# Patient Record
Sex: Female | Born: 1935 | Race: White | Hispanic: No | Smoking: Never smoker
Health system: Southern US, Community
[De-identification: ages and names within clinical notes are randomized; demographics above are authoritative.]

## PROBLEM LIST (undated history)

## (undated) DIAGNOSIS — Z8739 Personal history of other diseases of the musculoskeletal system and connective tissue: Secondary | ICD-10-CM

## (undated) DIAGNOSIS — C801 Malignant (primary) neoplasm, unspecified: Secondary | ICD-10-CM

## (undated) DIAGNOSIS — H903 Sensorineural hearing loss, bilateral: Secondary | ICD-10-CM

## (undated) DIAGNOSIS — E559 Vitamin D deficiency, unspecified: Secondary | ICD-10-CM

## (undated) DIAGNOSIS — K5520 Angiodysplasia of colon without hemorrhage: Secondary | ICD-10-CM

## (undated) DIAGNOSIS — K219 Gastro-esophageal reflux disease without esophagitis: Secondary | ICD-10-CM

## (undated) DIAGNOSIS — K639 Disease of intestine, unspecified: Secondary | ICD-10-CM

## (undated) DIAGNOSIS — E7800 Pure hypercholesterolemia, unspecified: Secondary | ICD-10-CM

## (undated) DIAGNOSIS — R002 Palpitations: Secondary | ICD-10-CM

## (undated) DIAGNOSIS — I10 Essential (primary) hypertension: Secondary | ICD-10-CM

## (undated) DIAGNOSIS — R32 Unspecified urinary incontinence: Secondary | ICD-10-CM

## (undated) HISTORY — PX: CHOLECYSTECTOMY: SHX55

## (undated) HISTORY — PX: ABDOMINAL HYSTERECTOMY: SHX81

## (undated) HISTORY — PX: COLONOSCOPY: SHX174

## (undated) HISTORY — PX: BACK SURGERY: SHX140

---

## 2004-09-04 ENCOUNTER — Ambulatory Visit: Payer: Self-pay | Admitting: Internal Medicine

## 2005-03-26 ENCOUNTER — Ambulatory Visit: Payer: Self-pay | Admitting: Unknown Physician Specialty

## 2005-10-31 ENCOUNTER — Ambulatory Visit: Payer: Self-pay | Admitting: Internal Medicine

## 2006-11-21 ENCOUNTER — Ambulatory Visit: Payer: Self-pay | Admitting: Internal Medicine

## 2007-12-11 ENCOUNTER — Ambulatory Visit: Payer: Self-pay | Admitting: Internal Medicine

## 2008-05-20 ENCOUNTER — Ambulatory Visit: Payer: Self-pay | Admitting: Physician Assistant

## 2008-05-24 ENCOUNTER — Ambulatory Visit: Payer: Self-pay | Admitting: Unknown Physician Specialty

## 2008-12-22 ENCOUNTER — Ambulatory Visit: Payer: Self-pay | Admitting: Internal Medicine

## 2009-11-25 ENCOUNTER — Ambulatory Visit: Payer: Self-pay | Admitting: Internal Medicine

## 2010-01-04 ENCOUNTER — Ambulatory Visit: Payer: Self-pay | Admitting: Internal Medicine

## 2010-06-15 ENCOUNTER — Ambulatory Visit: Payer: Self-pay | Admitting: Unknown Physician Specialty

## 2010-09-03 DIAGNOSIS — R002 Palpitations: Secondary | ICD-10-CM | POA: Insufficient documentation

## 2010-09-03 HISTORY — DX: Palpitations: R00.2

## 2011-02-20 ENCOUNTER — Ambulatory Visit: Payer: Self-pay | Admitting: Internal Medicine

## 2012-02-21 ENCOUNTER — Ambulatory Visit: Payer: Self-pay | Admitting: Internal Medicine

## 2013-02-23 ENCOUNTER — Ambulatory Visit: Payer: Self-pay | Admitting: Internal Medicine

## 2014-02-24 ENCOUNTER — Ambulatory Visit: Payer: Self-pay | Admitting: Internal Medicine

## 2014-03-08 ENCOUNTER — Ambulatory Visit: Payer: Self-pay | Admitting: Otolaryngology

## 2015-01-06 ENCOUNTER — Other Ambulatory Visit: Payer: Self-pay | Admitting: Internal Medicine

## 2015-01-06 DIAGNOSIS — Z1231 Encounter for screening mammogram for malignant neoplasm of breast: Secondary | ICD-10-CM

## 2015-03-01 ENCOUNTER — Ambulatory Visit
Admission: RE | Admit: 2015-03-01 | Discharge: 2015-03-01 | Disposition: A | Discharge status: Home / Self Care | Payer: Medicare Other | Source: Ambulatory Visit | Attending: Internal Medicine | Admitting: Internal Medicine

## 2015-03-01 DIAGNOSIS — Z1231 Encounter for screening mammogram for malignant neoplasm of breast: Secondary | ICD-10-CM | POA: Diagnosis present

## 2015-03-01 HISTORY — DX: Malignant (primary) neoplasm, unspecified: C80.1

## 2015-07-07 DIAGNOSIS — D692 Other nonthrombocytopenic purpura: Secondary | ICD-10-CM | POA: Insufficient documentation

## 2015-07-07 DIAGNOSIS — Z85820 Personal history of malignant melanoma of skin: Secondary | ICD-10-CM | POA: Insufficient documentation

## 2016-01-10 ENCOUNTER — Other Ambulatory Visit: Payer: Self-pay | Admitting: Internal Medicine

## 2016-01-10 DIAGNOSIS — R739 Hyperglycemia, unspecified: Secondary | ICD-10-CM | POA: Insufficient documentation

## 2016-01-10 DIAGNOSIS — Z1231 Encounter for screening mammogram for malignant neoplasm of breast: Secondary | ICD-10-CM

## 2016-01-10 DIAGNOSIS — R7303 Prediabetes: Secondary | ICD-10-CM | POA: Insufficient documentation

## 2016-03-01 ENCOUNTER — Ambulatory Visit
Admission: RE | Admit: 2016-03-01 | Discharge: 2016-03-01 | Disposition: A | Discharge status: Home / Self Care | Payer: Medicare Other | Source: Ambulatory Visit | Attending: Internal Medicine | Admitting: Internal Medicine

## 2016-03-01 DIAGNOSIS — Z1231 Encounter for screening mammogram for malignant neoplasm of breast: Secondary | ICD-10-CM | POA: Diagnosis not present

## 2017-01-18 ENCOUNTER — Other Ambulatory Visit: Payer: Self-pay | Admitting: Internal Medicine

## 2017-01-18 DIAGNOSIS — Z1231 Encounter for screening mammogram for malignant neoplasm of breast: Secondary | ICD-10-CM

## 2017-03-04 ENCOUNTER — Ambulatory Visit
Admission: RE | Admit: 2017-03-04 | Discharge: 2017-03-04 | Disposition: A | Discharge status: Home / Self Care | Payer: Medicare Other | Source: Ambulatory Visit | Attending: Internal Medicine | Admitting: Internal Medicine

## 2017-03-04 DIAGNOSIS — Z1231 Encounter for screening mammogram for malignant neoplasm of breast: Secondary | ICD-10-CM | POA: Insufficient documentation

## 2017-07-25 ENCOUNTER — Encounter: Payer: Self-pay | Admitting: *Deleted

## 2017-07-26 ENCOUNTER — Encounter
Admission: RE | Disposition: A | Discharge status: Home / Self Care | Payer: Self-pay | Source: Ambulatory Visit | Attending: Unknown Physician Specialty

## 2017-07-26 ENCOUNTER — Other Ambulatory Visit: Payer: Self-pay

## 2017-07-26 ENCOUNTER — Encounter: Payer: Self-pay | Admitting: *Deleted

## 2017-07-26 ENCOUNTER — Ambulatory Visit: Payer: Medicare Other | Admitting: Anesthesiology

## 2017-07-26 ENCOUNTER — Ambulatory Visit
Admission: RE | Admit: 2017-07-26 | Discharge: 2017-07-26 | Disposition: A | Discharge status: Home / Self Care | Payer: Medicare Other | Source: Ambulatory Visit | Attending: Unknown Physician Specialty | Admitting: Unknown Physician Specialty

## 2017-07-26 DIAGNOSIS — I10 Essential (primary) hypertension: Secondary | ICD-10-CM | POA: Insufficient documentation

## 2017-07-26 DIAGNOSIS — E559 Vitamin D deficiency, unspecified: Secondary | ICD-10-CM | POA: Diagnosis not present

## 2017-07-26 DIAGNOSIS — Q438 Other specified congenital malformations of intestine: Secondary | ICD-10-CM | POA: Insufficient documentation

## 2017-07-26 DIAGNOSIS — K640 First degree hemorrhoids: Secondary | ICD-10-CM | POA: Diagnosis not present

## 2017-07-26 DIAGNOSIS — Z79899 Other long term (current) drug therapy: Secondary | ICD-10-CM | POA: Diagnosis not present

## 2017-07-26 DIAGNOSIS — D120 Benign neoplasm of cecum: Secondary | ICD-10-CM | POA: Diagnosis not present

## 2017-07-26 DIAGNOSIS — Z86010 Personal history of colonic polyps: Secondary | ICD-10-CM | POA: Insufficient documentation

## 2017-07-26 DIAGNOSIS — Z800 Family history of malignant neoplasm of digestive organs: Secondary | ICD-10-CM | POA: Insufficient documentation

## 2017-07-26 DIAGNOSIS — Z85828 Personal history of other malignant neoplasm of skin: Secondary | ICD-10-CM | POA: Insufficient documentation

## 2017-07-26 DIAGNOSIS — K219 Gastro-esophageal reflux disease without esophagitis: Secondary | ICD-10-CM | POA: Insufficient documentation

## 2017-07-26 DIAGNOSIS — Z1211 Encounter for screening for malignant neoplasm of colon: Secondary | ICD-10-CM | POA: Diagnosis not present

## 2017-07-26 HISTORY — PX: COLONOSCOPY WITH PROPOFOL: SHX5780

## 2017-07-26 HISTORY — DX: Pure hypercholesterolemia, unspecified: E78.00

## 2017-07-26 HISTORY — DX: Palpitations: R00.2

## 2017-07-26 HISTORY — DX: Personal history of other diseases of the musculoskeletal system and connective tissue: Z87.39

## 2017-07-26 HISTORY — DX: Unspecified urinary incontinence: R32

## 2017-07-26 HISTORY — DX: Vitamin D deficiency, unspecified: E55.9

## 2017-07-26 HISTORY — DX: Disease of intestine, unspecified: K63.9

## 2017-07-26 HISTORY — DX: Angiodysplasia of colon without hemorrhage: K55.20

## 2017-07-26 HISTORY — DX: Essential (primary) hypertension: I10

## 2017-07-26 HISTORY — DX: Gastro-esophageal reflux disease without esophagitis: K21.9

## 2017-07-26 SURGERY — COLONOSCOPY WITH PROPOFOL
Anesthesia: General

## 2017-07-26 MED ORDER — EPHEDRINE SULFATE-NACL 50-0.9 MG/10ML-% IV SOSY
PREFILLED_SYRINGE | INTRAVENOUS | Status: DC | PRN
  Administered 2017-07-26: 10 mg via INTRAVENOUS

## 2017-07-26 MED ORDER — PROPOFOL 500 MG/50ML IV EMUL
INTRAVENOUS | Status: AC
  Filled 2017-07-26: qty 50

## 2017-07-26 MED ORDER — SODIUM CHLORIDE 0.9 % IV SOLN: INTRAVENOUS | Status: DC

## 2017-07-26 MED ORDER — EPHEDRINE SULFATE 50 MG/ML IJ SOLN
INTRAMUSCULAR | Status: AC
  Filled 2017-07-26: qty 1

## 2017-07-26 MED ORDER — PROPOFOL 10 MG/ML IV BOLUS
INTRAVENOUS | Status: DC | PRN
  Administered 2017-07-26: 90 mg via INTRAVENOUS

## 2017-07-26 MED ORDER — SODIUM CHLORIDE 0.9 % IV SOLN
INTRAVENOUS | Status: DC
  Administered 2017-07-26: 08:00:00 via INTRAVENOUS

## 2017-07-26 MED ORDER — PROPOFOL 500 MG/50ML IV EMUL
INTRAVENOUS | Status: DC | PRN
  Administered 2017-07-26: 100 ug/kg/min via INTRAVENOUS

## 2017-07-26 MED ORDER — GLYCOPYRROLATE 0.2 MG/ML IJ SOLN
INTRAMUSCULAR | Status: AC
  Filled 2017-07-26: qty 1

## 2017-07-26 NOTE — Anesthesia Post-op Follow-up Note (Signed)
Anesthesia QCDR form completed.        

## 2017-07-26 NOTE — OR Nursing (Signed)
Switched to pediatric scope

## 2017-07-26 NOTE — Op Note (Signed)
[facility] Gastroenterology Patient Name: Jacqueline Carney Procedure Date: 07/26/2017 8:16 AM MRN: [id number] Account #: [id number] Date of Birth: Aug 14, 1933 Admit Type: Outpatient Age: 84 Room: [facility] ENDO ROOM 4 Gender: Female Note Status: Finalized Procedure:            Colonoscopy Indications:          High risk colon cancer surveillance: Personal history                        of colonic polyps Providers:            Manya Silvas, MD Referring MD:         Ramonita Lab, MD (Referring MD) Medicines:            Propofol per Anesthesia Complications:        No immediate complications. Procedure:            Pre-Anesthesia Assessment:                       - After reviewing the risks and benefits, the patient                        was deemed in satisfactory condition to undergo the                        procedure.                       After obtaining informed consent, the colonoscope was                        passed under direct vision. Throughout the procedure,                        the patient's blood pressure, pulse, and oxygen                        saturations were monitored continuously. The                        Colonoscope was introduced through the anus and                        advanced to the the cecum, identified by appendiceal                        orifice and ileocecal valve. The colonoscopy was                        performed with difficulty due to a redundant colon,                        significant looping and a tortuous colon. Successful                        completion of the procedure was aided by withdrawing                        the scope and replacing with the pediatric colonoscope.  The patient tolerated the procedure well. The quality                        of the bowel preparation was excellent. Findings:      The colon was very tortuous with difficult passage and a pediatric       colonoscope worked  very well.      A diminutive polyp was found in the cecum. The polyp was sessile. The       polyp was removed with a jumbo cold forceps. Resection and retrieval       were complete.      Internal hemorrhoids were found during endoscopy. The hemorrhoids were       small and Grade I (internal hemorrhoids that do not prolapse).      The exam was otherwise without abnormality. Impression:           - One diminutive polyp in the cecum, removed with a                        jumbo cold forceps. Resected and retrieved.                       - Internal hemorrhoids.                       - The examination was otherwise normal. Recommendation:       - Await pathology results. Manya Silvas, MD 07/26/2017 9:18:27 AM This report has been signed electronically. Number of Addenda: 0 Note Initiated On: 07/26/2017 8:16 AM Scope Withdrawal Time: 0 hours 13 minutes 15 seconds  Total Procedure Duration: 0 hours 35 minutes 29 seconds       [facility]

## 2017-07-26 NOTE — Transfer of Care (Signed)
Immediate Anesthesia Transfer of Care Note  Patient: Jacqueline Carney  Procedure(s) Performed: COLONOSCOPY WITH PROPOFOL (N/A )  Patient Location: Endoscopy Unit  Anesthesia Type:General  Level of Consciousness: drowsy and patient cooperative  Airway & Oxygen Therapy: Patient Spontanous Breathing and Patient connected to nasal cannula oxygen  Post-op Assessment: Report given to RN and Post -op Vital signs reviewed and stable  Post vital signs: Reviewed and stable  Last Vitals:  Vitals Value Taken Time  BP 134/58 07/26/2017  9:19 AM  Temp 36.1 C 07/26/2017  9:18 AM  Pulse 73 07/26/2017  9:20 AM  Resp 22 07/26/2017  9:20 AM  SpO2 100 % 07/26/2017  9:20 AM  Vitals shown include unvalidated device data.  Last Pain:  Vitals:   07/26/17 0918  TempSrc: Tympanic  PainSc:          Complications: No apparent anesthesia complications

## 2017-07-26 NOTE — Anesthesia Preprocedure Evaluation (Signed)
Anesthesia Evaluation  Patient identified by MRN, date of birth, ID band Patient awake    Reviewed: Allergy & Precautions, NPO status , Patient's Chart, lab work & pertinent test results  History of Anesthesia Complications Negative for: history of anesthetic complications  Airway Mallampati: II       Dental   Pulmonary neg sleep apnea, neg COPD,           Cardiovascular (-) hypertension(-) Past MI and (-) CHF (-) dysrhythmias (-) Valvular Problems/Murmurs     Neuro/Psych neg Seizures    GI/Hepatic Neg liver ROS, GERD  Medicated and Controlled,  Endo/Other  neg diabetes  Renal/GU negative Renal ROS     Musculoskeletal   Abdominal   Peds  Hematology   Anesthesia Other Findings   Reproductive/Obstetrics                             Anesthesia Physical Anesthesia Plan  ASA: II  Anesthesia Plan: General   Post-op Pain Management:    Induction: Intravenous  PONV Risk Score and Plan: 3 and TIVA, Propofol infusion and Treatment may vary due to age or medical condition  Airway Management Planned: Nasal Cannula  Additional Equipment:   Intra-op Plan:   Post-operative Plan:   Informed Consent: I have reviewed the patients History and Physical, chart, labs and discussed the procedure including the risks, benefits and alternatives for the proposed anesthesia with the patient or authorized representative who has indicated his/her understanding and acceptance.     Plan Discussed with:   Anesthesia Plan Comments:         Anesthesia Quick Evaluation

## 2017-07-26 NOTE — H&P (Signed)
Primary Care Physician:  Adin Hector, MD Primary Gastroenterologist:  Dr. Vira Agar  Pre-Procedure History & Physical: HPI:  Jacqueline Carney is a 84 y.o. female is here for an colonoscopy.  For personal history of colon polyps and family history of colon cancer in mother.   Past Medical History:  Diagnosis Date  . Bladder incontinence   . Cancer ([place])    skin  . GERD (gastroesophageal reflux disease)   . H/O degenerative disc disease   . Hypercholesterolemia   . Hypertension   . Palpitations 09/2010   stress echo negative for ischemia, holter without sigmificant abnormality  . Vascular ectasia of colon   . Vitamin D deficiency, unspecified     Past Surgical History:  Procedure Laterality Date  . ABDOMINAL HYSTERECTOMY    . BACK SURGERY    . CHOLECYSTECTOMY    . COLONOSCOPY     with colon polyps    Prior to Admission medications   Medication Sig Start Date End Date Taking? Authorizing Provider  calcium-vitamin D (OSCAL WITH D) 500-200 MG-UNIT tablet Take 1 tablet by mouth daily.   Yes [provider]  cyanocobalamin 1000 MCG tablet Take 1,000 mcg by mouth daily.   Yes [provider]  docusate sodium (COLACE) 100 MG capsule Take 100 mg by mouth daily.   Yes [provider]  pantoprazole (PROTONIX) 40 MG tablet Take 40 mg by mouth daily.   Yes [provider]    Allergies as of 06/26/2017  . (No Known Allergies)    Family History  Problem Relation Age of Onset  . Breast cancer Neg Hx     Social History   Socioeconomic History  . Marital status: Widowed    Spouse name: Not on file  . Number of children: Not on file  . Years of education: Not on file  . Highest education level: Not on file  Occupational History  . Not on file  Social Needs  . Financial resource strain: Not on file  . Food insecurity:    Worry: Not on file    Inability: Not on file  . Transportation needs:    Medical: Not on file   Non-medical: Not on file  Tobacco Use  . Smoking status: Never Smoker  . Smokeless tobacco: Never Used  Substance and Sexual Activity  . Alcohol use: Never    Frequency: Never  . Drug use: Never  . Sexual activity: Not on file  Lifestyle  . Physical activity:    Days per week: Not on file    Minutes per session: Not on file  . Stress: Not on file  Relationships  . Social connections:    Talks on phone: Not on file    Gets together: Not on file    Attends religious service: Not on file    Active member of club or organization: Not on file    Attends meetings of clubs or organizations: Not on file    Relationship status: Not on file  . Intimate partner violence:    Fear of current or ex partner: Not on file    Emotionally abused: Not on file    Physically abused: Not on file    Forced sexual activity: Not on file  Other Topics Concern  . Not on file  Social History Narrative  . Not on file    Review of Systems: See HPI, otherwise negative ROS  Physical Exam: BP (!) 154/55   Pulse 78  Temp (!) 97.4 F (36.3 C) (Tympanic)   Resp 20   Ht 5\' 5"  (1.651 m)   Wt 83.9 kg (185 lb)   SpO2 100%   BMI 30.79 kg/m  General:   Alert,  pleasant and cooperative in NAD Head:  Normocephalic and atraumatic. Neck:  Supple; no masses or thyromegaly. Lungs:  Clear throughout to auscultation.    Heart:  Regular rate and rhythm. Abdomen:  Soft, nontender and nondistended. Normal bowel sounds, without guarding, and without rebound.   Neurologic:  Alert and  oriented x4;  grossly normal neurologically.  Impression/Plan: DAVETTE Carney is here for an colonoscopy to be performed for [facility] colon polyps and family history of colon cancer.  Risks, benefits, limitations, and alternatives regarding  colonoscopy have been reviewed with the patient.  Questions have been answered.  All parties agreeable.   Gaylyn Cheers, MD  07/26/2017, 8:30 AM

## 2017-07-26 NOTE — Anesthesia Postprocedure Evaluation (Signed)
Anesthesia Post Note  Patient: Jacqueline Carney  Procedure(s) Performed: COLONOSCOPY WITH PROPOFOL (N/A )  Patient location during evaluation: Endoscopy Anesthesia Type: General Level of consciousness: awake and alert Pain management: pain level controlled Vital Signs Assessment: post-procedure vital signs reviewed and stable Respiratory status: spontaneous breathing and respiratory function stable Cardiovascular status: stable Anesthetic complications: no     Last Vitals:  Vitals:   07/26/17 0819 07/26/17 0918  BP: (!) 154/55 (!) 134/58  Pulse: 78   Resp: 20   Temp: (!) 36.3 C (!) 36.1 C  SpO2: 100%     Last Pain:  Vitals:   07/26/17 0918  TempSrc: Tympanic  PainSc:                  [name] K

## 2017-07-30 ENCOUNTER — Encounter: Payer: Self-pay | Admitting: Unknown Physician Specialty

## 2017-07-30 LAB — SURGICAL PATHOLOGY

## 2017-12-09 ENCOUNTER — Emergency Department: Payer: Medicare Other

## 2017-12-09 ENCOUNTER — Emergency Department
Admission: EM | Admit: 2017-12-09 | Discharge: 2017-12-09 | Disposition: A | Discharge status: Home / Self Care | Payer: Medicare Other | Attending: Student in an Organized Health Care Education/Training Program | Admitting: Student in an Organized Health Care Education/Training Program

## 2017-12-09 ENCOUNTER — Encounter: Payer: Self-pay | Admitting: Emergency Medicine

## 2017-12-09 DIAGNOSIS — R42 Dizziness and giddiness: Secondary | ICD-10-CM | POA: Insufficient documentation

## 2017-12-09 DIAGNOSIS — Z79899 Other long term (current) drug therapy: Secondary | ICD-10-CM | POA: Diagnosis not present

## 2017-12-09 DIAGNOSIS — I10 Essential (primary) hypertension: Secondary | ICD-10-CM | POA: Insufficient documentation

## 2017-12-09 LAB — COMPREHENSIVE METABOLIC PANEL
ALBUMIN: 4 g/dL (ref 3.5–5.0)
ALK PHOS: 74 U/L (ref 38–126)
ALT: 28 U/L (ref 0–44)
AST: 30 U/L (ref 15–41)
Anion gap: 8 (ref 5–15)
BUN: 19 mg/dL (ref 8–23)
CHLORIDE: 106 mmol/L (ref 98–111)
CO2: 27 mmol/L (ref 22–32)
CREATININE: 0.76 mg/dL (ref 0.44–1.00)
Calcium: 9 mg/dL (ref 8.9–10.3)
GFR calc Af Amer: >60 mL/min (ref >60)
GFR calc non Af Amer: >60 mL/min (ref >60)
GLUCOSE: 117 mg/dL — AB (ref 70–99)
Potassium: 4.4 mmol/L (ref 3.5–5.1)
SODIUM: 141 mmol/L (ref 135–145)
Total Bilirubin: 0.8 mg/dL (ref 0.3–1.2)
Total Protein: 6.6 g/dL (ref 6.5–8.1)

## 2017-12-09 LAB — URINALYSIS, COMPLETE (UACMP) WITH MICROSCOPIC
Bilirubin Urine: NEGATIVE
Glucose, UA: NEGATIVE mg/dL
Hgb urine dipstick: NEGATIVE
KETONES UR: NEGATIVE mg/dL
Leukocytes, UA: NEGATIVE
Nitrite: NEGATIVE
PROTEIN: NEGATIVE mg/dL
Specific Gravity, Urine: 1.014 (ref 1.005–1.030)
pH: 6 (ref 5.0–8.0)

## 2017-12-09 LAB — CBC WITH DIFFERENTIAL/PLATELET
BASOS ABS: 0 10*3/uL (ref 0–0.1)
Basophils Relative: 1 %
Eosinophils Absolute: 0.1 10*3/uL (ref 0–0.7)
Eosinophils Relative: 2 %
HCT: 40.6 % (ref 35.0–47.0)
HEMOGLOBIN: 14.1 g/dL (ref 12.0–16.0)
Lymphocytes Relative: 35 %
Lymphs Abs: 1.8 10*3/uL (ref 1.0–3.6)
MCH: 32.7 pg (ref 26.0–34.0)
MCHC: 34.8 g/dL (ref 32.0–36.0)
MCV: 94 fL (ref 80.0–100.0)
Monocytes Absolute: 0.4 10*3/uL (ref 0.2–0.9)
Monocytes Relative: 7 %
NEUTROS PCT: 55 %
Neutro Abs: 2.8 10*3/uL (ref 1.4–6.5)
Platelets: 187 10*3/uL (ref 150–440)
RBC: 4.32 MIL/uL (ref 3.80–5.20)
RDW: 13.4 % (ref 11.5–14.5)
WBC: 5 10*3/uL (ref 3.6–11.0)

## 2017-12-09 LAB — TROPONIN I: Troponin I: <0.03 ng/mL (ref <0.03)

## 2017-12-09 MED ORDER — SODIUM CHLORIDE 0.9 % IV BOLUS
500.0000 mL | Freq: Once | INTRAVENOUS | Status: AC
  Administered 2017-12-09: 500 mL via INTRAVENOUS

## 2017-12-09 NOTE — ED Notes (Signed)
Pt able to ambulate in room with no complaints of dizziness.

## 2017-12-09 NOTE — Discharge Instructions (Addendum)
Please follow up with PCP this week for repeat BP check and evaluation.  Return to the ER for worsening symptoms, questions or concerns.

## 2017-12-09 NOTE — ED Notes (Signed)
ED Provider at bedside. 

## 2017-12-09 NOTE — ED Provider Notes (Signed)
[facility] Emergency Department Provider Note    First MD Initiated Contact with Patient 12/09/17 0932     (approximate)  I have reviewed the triage vital signs and the nursing notes.   HISTORY  Chief Complaint Dizziness    HPI Jacqueline Carney is a 84 y.o. female below listed past medical history presents the ER with chief complaint of lightheadedness and "dizziness" that started this morning she awoke from bed.  States that she felt lightheaded and was afraid that she was in a fall over.  Laid in bed for quite a while and then finally after having to use the restroom decided to try getting up to go to the bathroom.  States that she was having to hold onto furniture and walls to make it to the bathroom.  Did not fall and hit her head.  Just felt unsteady on her feet.  Denies any chest pain or shortness of breath.  No nausea or vomiting.  No numbness or tingling.  No headaches but feels that her "head feels funny ".  Cannot provide any additional characteristics or descriptors of this symptom.  Denies any previous history of stroke.  Did take aspirin.  On arrival to the ER feels that her symptoms have resolved.    Past Medical History:  Diagnosis Date  . Bladder incontinence   . Cancer ([place])    skin  . GERD (gastroesophageal reflux disease)   . H/O degenerative disc disease   . Hypercholesterolemia   . Hypertension   . Palpitations 09/2010   stress echo negative for ischemia, holter without sigmificant abnormality  . Vascular ectasia of colon   . Vitamin D deficiency, unspecified    Family History  Problem Relation Age of Onset  . Breast cancer Neg Hx    Past Surgical History:  Procedure Laterality Date  . ABDOMINAL HYSTERECTOMY    . BACK SURGERY    . CHOLECYSTECTOMY    . COLONOSCOPY     with colon polyps  . COLONOSCOPY WITH PROPOFOL N/A 07/26/2017   Procedure: COLONOSCOPY WITH PROPOFOL;  Surgeon: Manya Silvas, MD;  Location: [facility]  ENDOSCOPY;  Service: Endoscopy;  Laterality: N/A;   There are no active problems to display for this patient.     Prior to Admission medications   Medication Sig Start Date End Date Taking? Authorizing Provider  calcium-vitamin D (OSCAL WITH D) 500-200 MG-UNIT tablet Take 1 tablet by mouth daily.    [provider]  cyanocobalamin 1000 MCG tablet Take 1,000 mcg by mouth daily.    [provider]  docusate sodium (COLACE) 100 MG capsule Take 100 mg by mouth daily.    [provider]  pantoprazole (PROTONIX) 40 MG tablet Take 40 mg by mouth daily.    [provider]    Allergies Patient has no known allergies.    Social History Social History   Tobacco Use  . Smoking status: Never Smoker  . Smokeless tobacco: Never Used  Substance Use Topics  . Alcohol use: Never    Frequency: Never  . Drug use: Never    Review of Systems Patient denies headaches, rhinorrhea, blurry vision, numbness, shortness of breath, chest pain, edema, cough, abdominal pain, nausea, vomiting, diarrhea, dysuria, fevers, rashes or hallucinations unless otherwise stated above in HPI. ____________________________________________   PHYSICAL EXAM:  VITAL SIGNS: Vitals:   12/09/17 1200 12/09/17 1248  BP: (!) 168/85 (!) 191/77  Pulse: 61 67  Resp: 17 (!) 9  Temp:  SpO2: 100% 100%    Constitutional: Alert and oriented.  Eyes: Conjunctivae are normal.  Head: Atraumatic. Nose: No congestion/rhinnorhea. Mouth/Throat: Mucous membranes are moist.   Neck: No stridor. Painless ROM.  Cardiovascular: Normal rate, regular rhythm. Grossly normal heart sounds.  Good peripheral circulation. Respiratory: Normal respiratory effort.  No retractions. Lungs CTAB. Gastrointestinal: Soft and nontender. No distention. No abdominal bruits. No CVA tenderness. Genitourinary:  Musculoskeletal: No lower extremity tenderness nor edema.  No joint effusions. Neurologic:  CN- intact.  No  facial droop, Normal FNF.  Normal heel to shin.  Sensation intact bilaterally. Normal speech and language. No gross focal neurologic deficits are appreciated. No gait instability.  Skin:  Skin is warm, dry and intact. No rash noted. Psychiatric: Mood and affect are normal. Speech and behavior are normal.  ____________________________________________   LABS (all labs ordered are listed, but only abnormal results are displayed)  Results for orders placed or performed during the hospital encounter of 12/09/17 (from the past 24 hour(s))  CBC with Differential/Platelet     Status: None   Collection Time: 12/09/17  9:39 AM  Result Value Ref Range   WBC 5.0 3.6 - 11.0 K/uL   RBC 4.32 3.80 - 5.20 MIL/uL   Hemoglobin 14.1 12.0 - 16.0 g/dL   HCT 40.6 35.0 - 47.0 %   MCV 94.0 80.0 - 100.0 fL   MCH 32.7 26.0 - 34.0 pg   MCHC 34.8 32.0 - 36.0 g/dL   RDW 13.4 11.5 - 14.5 %   Platelets 187 150 - 440 K/uL   Neutrophils Relative % 55 %   Neutro Abs 2.8 1.4 - 6.5 K/uL   Lymphocytes Relative 35 %   Lymphs Abs 1.8 1.0 - 3.6 K/uL   Monocytes Relative 7 %   Monocytes Absolute 0.4 0.2 - 0.9 K/uL   Eosinophils Relative 2 %   Eosinophils Absolute 0.1 0 - 0.7 K/uL   Basophils Relative 1 %   Basophils Absolute 0.0 0 - 0.1 K/uL  Comprehensive metabolic panel     Status: Abnormal   Collection Time: 12/09/17  9:39 AM  Result Value Ref Range   Sodium 141 135 - 145 mmol/L   Potassium 4.4 3.5 - 5.1 mmol/L   Chloride 106 98 - 111 mmol/L   CO2 27 22 - 32 mmol/L   Glucose, Bld 117 (H) 70 - 99 mg/dL   BUN 19 8 - 23 mg/dL   Creatinine, Ser 0.76 0.44 - 1.00 mg/dL   Calcium 9.0 8.9 - 10.3 mg/dL   Total Protein 6.6 6.5 - 8.1 g/dL   Albumin 4.0 3.5 - 5.0 g/dL   AST 30 15 - 41 U/L   ALT 28 0 - 44 U/L   Alkaline Phosphatase 74 38 - 126 U/L   Total Bilirubin 0.8 0.3 - 1.2 mg/dL   GFR calc non Af Amer >60 >60 mL/min   GFR calc Af Amer >60 >60 mL/min   Anion gap 8 5 - 15  Troponin I     Status: None    Collection Time: 12/09/17  9:39 AM  Result Value Ref Range   Troponin I <0.03 <0.03 ng/mL  Urinalysis, Complete w Microscopic     Status: Abnormal   Collection Time: 12/09/17  9:39 AM  Result Value Ref Range   Color, Urine YELLOW (A) YELLOW   APPearance HAZY (A) CLEAR   Specific Gravity, Urine 1.014 1.005 - 1.030   pH 6.0 5.0 - 8.0   Glucose, UA NEGATIVE NEGATIVE  mg/dL   Hgb urine dipstick NEGATIVE NEGATIVE   Bilirubin Urine NEGATIVE NEGATIVE   Ketones, ur NEGATIVE NEGATIVE mg/dL   Protein, ur NEGATIVE NEGATIVE mg/dL   Nitrite NEGATIVE NEGATIVE   Leukocytes, UA NEGATIVE NEGATIVE   RBC / HPF 0-5 0 - 5 RBC/hpf   WBC, UA 0-5 0 - 5 WBC/hpf   Bacteria, UA RARE (A) NONE SEEN   Squamous Epithelial / LPF 0-5 0 - 5   Mucus PRESENT    ____________________________________________  EKG My review and personal interpretation at Time: 9:17   Indication: dizziness  Rate: 70  Rhythm: sinus Axis: normal Other: normal intervals, no stemi ____________________________________________  RADIOLOGY  I personally reviewed all radiographic images ordered to evaluate for the above acute complaints and reviewed radiology reports and findings.  These findings were personally discussed with the patient.  Please see medical record for radiology report.  ____________________________________________   PROCEDURES  Procedure(s) performed:  Procedures    Critical Care performed: no ____________________________________________   INITIAL IMPRESSION / ASSESSMENT AND PLAN / ED COURSE  Pertinent labs & imaging results that were available during my care of the patient were reviewed by me and considered in my medical decision making (see chart for details).   DDX: dehydration, orthostasis, cva, tia, electrolyte abn, uti, vertigo  Jacqueline Carney is a 84 y.o. who presents to the ED with symptoms as described above.  Patient is afebrile mildly hypertensive but no focal deficits at this time.  Is  primarily dehydration related given her poor oral intake with reassuring neuro exam but blood work will be sent for the above differential.  CT imaging will be ordered to evaluate and ensure that she does not have any evidence of subdural hematoma or other acute intracranial abnormality.  She does not come to the ER very frequently.  Urinalysis does not show any evidence of infection.  Clinical Course as of Dec 09 1499  Mon Dec 09, 2017  1258 Patient reassessed with improvement in symptoms after IV bolus.  Denies any chest pain or pressure.  Does have mildly elevated blood pressure but is asymptomatic.  States that she feels primarily dehydrated after not drinking much over the past 2 days as the fluid sniffily helped her symptoms.  Repeat neuro exam is nonfocal.  Do suspect some component of dehydration.  Also discussed possibility of TIA or posterior circulation stroke however given the positional nature of her symptoms have low suspicion for CVA.  Did discuss option for admission the hospital for blood pressure management as well as neurology consultation patient states that she would prefer to follow-up with her PCP.  She is completely a symptom medic at this time and has good established outpatient care do believe that is reasonable option.   [PR]    Clinical Course User Index [PR] Merlyn Lot, MD     As part of my medical decision making, I reviewed the following data within the [facility] notes reviewed and incorporated, Labs reviewed, notes from prior ED visits and [place] Controlled Substance Database   ____________________________________________   FINAL CLINICAL IMPRESSION(S) / ED DIAGNOSES  Final diagnoses:  Dizziness      NEW MEDICATIONS STARTED DURING THIS VISIT:  Discharge Medication List as of 12/09/2017  1:06 PM       Note:  This document was prepared using Dragon voice recognition software and may include unintentional dictation errors.      Merlyn Lot, MD 12/09/17 [phone]

## 2017-12-09 NOTE — ED Triage Notes (Signed)
Pt reports awoke this am and was dizzy. Pt states it does not feel like the room is spinning she is just dizzy.Pt denies pain, nausea or other sx's.

## 2018-01-20 ENCOUNTER — Other Ambulatory Visit: Payer: Self-pay | Admitting: Internal Medicine

## 2018-01-20 DIAGNOSIS — Z1231 Encounter for screening mammogram for malignant neoplasm of breast: Secondary | ICD-10-CM

## 2018-03-06 ENCOUNTER — Ambulatory Visit
Admission: RE | Admit: 2018-03-06 | Discharge: 2018-03-06 | Disposition: A | Discharge status: Home / Self Care | Payer: Medicare Other | Source: Ambulatory Visit | Attending: Internal Medicine | Admitting: Internal Medicine

## 2018-03-06 DIAGNOSIS — Z1231 Encounter for screening mammogram for malignant neoplasm of breast: Secondary | ICD-10-CM | POA: Insufficient documentation

## 2018-05-05 ENCOUNTER — Ambulatory Visit: Payer: Medicare Other | Admitting: Podiatry

## 2018-08-19 DIAGNOSIS — E7800 Pure hypercholesterolemia, unspecified: Secondary | ICD-10-CM | POA: Insufficient documentation

## 2018-08-19 DIAGNOSIS — E559 Vitamin D deficiency, unspecified: Secondary | ICD-10-CM | POA: Insufficient documentation

## 2018-08-19 DIAGNOSIS — I10 Essential (primary) hypertension: Secondary | ICD-10-CM | POA: Insufficient documentation

## 2018-08-19 DIAGNOSIS — Z8739 Personal history of other diseases of the musculoskeletal system and connective tissue: Secondary | ICD-10-CM | POA: Insufficient documentation

## 2018-08-19 DIAGNOSIS — R32 Unspecified urinary incontinence: Secondary | ICD-10-CM | POA: Insufficient documentation

## 2018-08-20 ENCOUNTER — Ambulatory Visit: Payer: Medicare Other | Admitting: Podiatry

## 2018-08-20 ENCOUNTER — Encounter: Payer: Self-pay | Admitting: Podiatry

## 2018-08-20 ENCOUNTER — Other Ambulatory Visit: Payer: Self-pay

## 2018-08-20 ENCOUNTER — Ambulatory Visit (INDEPENDENT_AMBULATORY_CARE_PROVIDER_SITE_OTHER): Payer: Medicare Other

## 2018-08-20 DIAGNOSIS — M779 Enthesopathy, unspecified: Secondary | ICD-10-CM

## 2018-08-20 DIAGNOSIS — M778 Other enthesopathies, not elsewhere classified: Secondary | ICD-10-CM

## 2018-08-20 DIAGNOSIS — T847XXA Infection and inflammatory reaction due to other internal orthopedic prosthetic devices, implants and grafts, initial encounter: Secondary | ICD-10-CM

## 2018-08-20 NOTE — Progress Notes (Signed)
Subjective:  Patient ID: Jacqueline Carney, female    DOB: 30-Mar-1933,  MRN: [id number] HPI Chief Complaint  Patient presents with  . Toe Pain    4th toe left - aching x few months, redness, some swelling, sore when walking, previous hammer toe surgery   . New Patient (Initial Visit)    Est pt 3+    85 y.o. female presents with the above complaint.   ROS: Denies fever chills nausea vomiting muscle aches pains calf pain back pain chest pain shortness of breath.  Past Medical History:  Diagnosis Date  . Bladder incontinence   . Cancer ([place])    skin  . GERD (gastroesophageal reflux disease)   . H/O degenerative disc disease   . Hypercholesterolemia   . Hypertension   . Palpitations 09/2010   stress echo negative for ischemia, holter without sigmificant abnormality  . Vascular ectasia of colon   . Vitamin D deficiency, unspecified    Past Surgical History:  Procedure Laterality Date  . ABDOMINAL HYSTERECTOMY    . BACK SURGERY    . CHOLECYSTECTOMY    . COLONOSCOPY     with colon polyps  . COLONOSCOPY WITH PROPOFOL N/A 07/26/2017   Procedure: COLONOSCOPY WITH PROPOFOL;  Surgeon: Manya Silvas, MD;  Location: [facility] ENDOSCOPY;  Service: Endoscopy;  Laterality: N/A;    Current Outpatient Medications:  .  alendronate (FOSAMAX) 70 MG tablet, Take by mouth., Disp: , Rfl:  .  fluorouracil (ADRUCIL) 500 MG/10ML SOLN, 50mg /ml bring with you to dermatologist office, Disp: , Rfl:  .  mupirocin ointment (BACTROBAN) 2 %, Apply topically., Disp: , Rfl:  .  triamcinolone acetonide (KENALOG) 10 MG/ML injection, by Intra-Lesional route., Disp: , Rfl:  .  calcium-vitamin D (OSCAL WITH D) 500-200 MG-UNIT tablet, Take 1 tablet by mouth daily., Disp: , Rfl:  .  cyanocobalamin 1000 MCG tablet, Take 1,000 mcg by mouth daily., Disp: , Rfl:  .  docusate sodium (COLACE) 100 MG capsule, Take 100 mg by mouth daily., Disp: , Rfl:  .  fluorouracil (EFUDEX) 5 % cream, Apply topically., Disp: , Rfl:   .  pantoprazole (PROTONIX) 40 MG tablet, Take 40 mg by mouth daily., Disp: , Rfl:   No Known Allergies Review of Systems Objective:  There were no vitals filed for this visit.  General: Well developed, nourished, in no acute distress, alert and oriented x3   Dermatological: Skin is warm, dry and supple bilateral. Nails x 10 are well maintained; remaining integument appears unremarkable at this time. There are no open sores, no preulcerative lesions, no rash or signs of infection present.  Vascular: Dorsalis Pedis artery and Posterior Tibial artery pedal pulses are 2/4 bilateral with immedate capillary fill time. Pedal hair growth present. No varicosities and no lower extremity edema present bilateral.   Neruologic: Grossly intact via light touch bilateral. Vibratory intact via tuning fork bilateral. Protective threshold with Semmes Wienstein monofilament intact to all pedal sites bilateral. Patellar and Achilles deep tendon reflexes 2+ bilateral. No Babinski or clonus noted bilateral.   Musculoskeletal: No gross boney pedal deformities bilateral. No pain, crepitus, or limitation noted with foot and ankle range of motion bilateral. Muscular strength 5/5 in all groups tested bilateral.  Painful fourth toe of the left foot demonstrates no erythema cellulitis drainage or odor darkened area on the lateral aspect of the toe distally does indicate the head of the screw.  This area is moderately tender on palpation.  Gait: Unassisted, Nonantalgic.    Radiographs:  Demonstrates a screw to the fourth toe that is loosened and backed out.  It is just before coming through the skin.  Assessment & Plan:   Assessment: Painful internal fixation fourth toe left foot.  Plan: We went over the consent form today line by my number by number giving her ample time to ask questions she saw fit regarding removal of screw fourth toe left foot.  I answered all of the questions regarding this procedure to the best  of my ability in layman's terms.  She understands that and is amenable to it.  I will follow-up with her in the near future for surgical intervention.     Kamran Coker T. [place], [place]

## 2018-08-20 NOTE — Patient Instructions (Signed)
Pre-Operative Instructions  Congratulations, you have decided to take an important step towards improving your quality of life.  You can be assured that the doctors and staff at [facility] will be with you every step of the way.  Here are some important things you should know:  1. Plan to be at the surgery center/hospital at least 1 (one) hour prior to your scheduled time, unless otherwise directed by the surgical center/hospital staff.  You must have a responsible adult accompany you, remain during the surgery and drive you home.  Make sure you have directions to the surgical center/hospital to ensure you arrive on time. 2. If you are having surgery at [facility] or [facility], you will need a copy of your medical history and physical form from your family physician within one month prior to the date of surgery. We will give you a form for your primary physician to complete.  3. We make every effort to accommodate the date you request for surgery.  However, there are times where surgery dates or times have to be moved.  We will contact you as soon as possible if a change in schedule is required.   4. No aspirin/ibuprofen for one week before surgery.  If you are on aspirin, any non-steroidal anti-inflammatory medications (Mobic, Aleve, Ibuprofen) should not be taken seven (7) days prior to your surgery.  You make take Tylenol for pain prior to surgery.  5. Medications - If you are taking daily heart and blood pressure medications, seizure, reflux, allergy, asthma, anxiety, pain or diabetes medications, make sure you notify the surgery center/hospital before the day of surgery so they can tell you which medications you should take or avoid the day of surgery. 6. No food or drink after midnight the night before surgery unless directed otherwise by surgical center/hospital staff. 7. No alcoholic beverages 24-hours prior to surgery.  No smoking 24-hours prior or 24-hours after  surgery. 8. Wear loose pants or shorts. They should be loose enough to fit over bandages, boots, and casts. 9. Don't wear slip-on shoes. Sneakers are preferred. 10. Bring your boot with you to the surgery center/hospital.  Also bring crutches or a walker if your physician has prescribed it for you.  If you do not have this equipment, it will be provided for you after surgery. 11. If you have not been contacted by the surgery center/hospital by the day before your surgery, call to confirm the date and time of your surgery. 12. Leave-time from work may vary depending on the type of surgery you have.  Appropriate arrangements should be made prior to surgery with your employer. 13. Prescriptions will be provided immediately following surgery by your doctor.  Fill these as soon as possible after surgery and take the medication as directed. Pain medications will not be refilled on weekends and must be approved by the doctor. 14. Remove nail polish on the operative foot and avoid getting pedicures prior to surgery. 15. Wash the night before surgery.  The night before surgery wash the foot and leg well with water and the antibacterial soap provided. Be sure to pay special attention to beneath the toenails and in between the toes.  Wash for at least three (3) minutes. Rinse thoroughly with water and dry well with a towel.  Perform this wash unless told not to do so by your physician.  Enclosed: 1 Ice pack (please put in freezer the night before surgery)   1 Hibiclens skin cleaner     Pre-op instructions  If you have any questions regarding the instructions, please do not hesitate to call our office.  [place]: [address], [place], [place] [address] -- [phone]  [place]: [address] [place], [place] [address] -- [phone]  [place]: [address]  , [place] [address] -- [phone]  [place]: [address], [address], [place], [place] [address] -- [phone]  Website:  [email or web address] 

## 2018-08-22 ENCOUNTER — Telehealth: Payer: Self-pay | Admitting: *Deleted

## 2018-08-22 NOTE — Telephone Encounter (Signed)
"  I saw Dr. Milinda Pointer yesterday about a surgery appointment.  Could you call me back?"

## 2018-08-25 NOTE — Telephone Encounter (Signed)
I am returning your call.  You want to schedule your surgery?  "Yes, I do."  Dr. Amalia Hailey does surgeries on Fridays.  Do you have at date that you like?  "No, whatever his next available date is."  He can do it on September 26, 2018.  "That's his earliest?"  Yes, due to the holiday coming up and he's going to be out of the office for a week.  "I guess I don't have a choice.  Schedule me for July 24.  What time?"  Someone from the surgical center will call you a day or two prior to your surgery date and will give you your arrival time.  "Will the person that brings me be able to go inside the surgery center or will they have to stay outside in the car?"  I am not sure, especially due to the change in the statistics for the Corona virus.  "I guess they will let me know as it get's closer to that date."  Yes, that is correct.

## 2018-09-10 ENCOUNTER — Telehealth: Payer: Self-pay | Admitting: *Deleted

## 2018-09-10 NOTE — Telephone Encounter (Signed)
DOS 09/26/2018; 20680 - REMOVAL FIXATION DEEP SCREW 4TH LEFT FOOT  [employer]: Effective Date - 03/05/2018 - -  Individual In-Network (Service Year) Deductible Member's plan does not have a deductible  Out-of-Pocket $23.18 MET YTD  [id number] remaining  $4,000.00 Plan Amt.  [facility] $0.00 Copayment for professional services rendered to you as an Outpatient.          This [employer] Medicare Advantage members plan does not currently require a prior authorization for these services. If you have general questions about the prior authorization requirements, please call us at [phone] or visit [email or web address] > Clinician Resources > Advance and Admission Notification Requirements. The number above acknowledges your notification. Please write this number down for future reference. Notification is not a guarantee of coverage or payment.  Decision ID #:[id number]

## 2018-09-25 ENCOUNTER — Other Ambulatory Visit: Payer: Self-pay | Admitting: Podiatry

## 2018-09-25 MED ORDER — CEPHALEXIN 500 MG PO CAPS: 500.0000 mg | ORAL_CAPSULE | Freq: Three times a day (TID) | ORAL | 0 refills | Status: DC

## 2018-09-25 MED ORDER — TRAMADOL HCL 50 MG PO TABS: 50.0000 mg | ORAL_TABLET | Freq: Three times a day (TID) | ORAL | 0 refills | Status: AC | PRN

## 2018-09-25 MED ORDER — ONDANSETRON HCL 4 MG PO TABS: 4.0000 mg | ORAL_TABLET | Freq: Three times a day (TID) | ORAL | 0 refills | Status: DC | PRN

## 2018-09-26 DIAGNOSIS — Z4889 Encounter for other specified surgical aftercare: Secondary | ICD-10-CM | POA: Diagnosis not present

## 2018-10-01 ENCOUNTER — Other Ambulatory Visit: Payer: Self-pay

## 2018-10-01 ENCOUNTER — Ambulatory Visit (INDEPENDENT_AMBULATORY_CARE_PROVIDER_SITE_OTHER): Payer: Medicare Other

## 2018-10-01 ENCOUNTER — Ambulatory Visit (INDEPENDENT_AMBULATORY_CARE_PROVIDER_SITE_OTHER): Payer: Medicare Other | Admitting: Podiatry

## 2018-10-01 VITALS — Temp 98.2°F

## 2018-10-01 DIAGNOSIS — T847XXA Infection and inflammatory reaction due to other internal orthopedic prosthetic devices, implants and grafts, initial encounter: Secondary | ICD-10-CM

## 2018-10-01 DIAGNOSIS — Z9889 Other specified postprocedural states: Secondary | ICD-10-CM

## 2018-10-01 DIAGNOSIS — M79672 Pain in left foot: Secondary | ICD-10-CM | POA: Diagnosis not present

## 2018-10-01 NOTE — Progress Notes (Signed)
She presents today for her first postop date of surgery September 26, 2018 removal of hardware fourth toe left foot.  States that is doing good have not had any problems with it.  Objective: Vital signs are stable alert and oriented x3.  Pulses are palpable.  Sutures are intact margins well coapted there is no purulence no malodor no signs of infection.  Radiographs confirm that there is no foreign body in the toe.  Assessment: Well-healing surgical toe fourth left.  Plan: Redressed the toe today dressed a compressive dressing sutures will be removed next visit.

## 2018-10-02 ENCOUNTER — Encounter: Payer: Self-pay | Admitting: Podiatry

## 2018-10-08 ENCOUNTER — Other Ambulatory Visit: Payer: Self-pay

## 2018-10-08 ENCOUNTER — Encounter: Payer: Self-pay | Admitting: Podiatry

## 2018-10-08 ENCOUNTER — Ambulatory Visit (INDEPENDENT_AMBULATORY_CARE_PROVIDER_SITE_OTHER): Payer: Medicare Other | Admitting: Podiatry

## 2018-10-08 VITALS — Temp 97.8°F

## 2018-10-08 DIAGNOSIS — Z9889 Other specified postprocedural states: Secondary | ICD-10-CM

## 2018-10-08 NOTE — Progress Notes (Signed)
She presents today 2 weeks status post removal of hardware fourth toe left foot states that doing better but some times will get some burning and stinging on the bottom of my toe.  She denies fever chills nausea vomiting muscle aches pains.  Objective: Surgical toe appears to be healing very nicely sutures are intact margins well coapted there is minimal edema no erythema cellulitis drainage or odor.  Assessment: Well-healing surgical toe.  Plan: Removal of all of the sutures today placed Covan and she will continue to do that daily and she will start getting this wet in the next couple of days.  She will continue use of her Darco shoe we will follow-up with her in 2 weeks for release.

## 2018-10-15 ENCOUNTER — Telehealth: Payer: Self-pay | Admitting: Podiatry

## 2018-10-15 NOTE — Telephone Encounter (Signed)
Pt had surgery on 09/26/18 and was having basically no pain until this past Sunday when she started having severe pain all of a sudden. Pt would like to know what she can do to help with the pain or if she should be concerned. Please give patient a call.

## 2018-10-16 NOTE — Telephone Encounter (Signed)
I spoke with patient , she stated she has been having "firing shooting stinging pains" in her foot that come and go and wanted to know what she could do for the pain.  I informed her that it could be her nerves waking up and will cause shooting pains once in a while.  I instructed her to take 2 Ibuprofen with  ES Tylenol to see if that helps.  She will keep her appt on 10/21/18 and call sooner if symptoms get worse.

## 2018-10-21 ENCOUNTER — Ambulatory Visit (INDEPENDENT_AMBULATORY_CARE_PROVIDER_SITE_OTHER): Payer: Medicare Other

## 2018-10-21 ENCOUNTER — Encounter: Payer: Self-pay | Admitting: Podiatry

## 2018-10-21 ENCOUNTER — Other Ambulatory Visit: Payer: Self-pay

## 2018-10-21 ENCOUNTER — Ambulatory Visit (INDEPENDENT_AMBULATORY_CARE_PROVIDER_SITE_OTHER): Payer: Self-pay | Admitting: Podiatry

## 2018-10-21 VITALS — Temp 98.3°F

## 2018-10-21 DIAGNOSIS — Z4889 Encounter for other specified surgical aftercare: Secondary | ICD-10-CM | POA: Diagnosis not present

## 2018-10-21 DIAGNOSIS — T847XXA Infection and inflammatory reaction due to other internal orthopedic prosthetic devices, implants and grafts, initial encounter: Secondary | ICD-10-CM

## 2018-10-21 DIAGNOSIS — Z9889 Other specified postprocedural states: Secondary | ICD-10-CM

## 2018-10-23 NOTE — Progress Notes (Signed)
   Subjective:  Patient presents today status post ROH left fourth toe. DOS: 09/26/2018. She states she is doing better. She reports some intermittent stinging, burning pain of the distal aspect of the toe. She has been using the post op shoe as directed. She denies modifying factors. Patient is here for further evaluation and treatment.    Past Medical History:  Diagnosis Date  . Bladder incontinence   . Cancer ([place])    skin  . GERD (gastroesophageal reflux disease)   . H/O degenerative disc disease   . Hypercholesterolemia   . Hypertension   . Palpitations 09/2010   stress echo negative for ischemia, holter without sigmificant abnormality  . Vascular ectasia of colon   . Vitamin D deficiency, unspecified       Objective/Physical Exam Neurovascular status intact.  Skin incisions appear to be well coapted. No sign of infectious process noted. No dehiscence. No active bleeding noted. Moderate edema noted to the surgical extremity.  Radiographic Exam:  Hardware removed from left fourth toe.   Assessment: 1. s/p ROH left fourth toe. DOS: 09/26/2018   Plan of Care:  1. Patient was evaluated. X-rays reviewed 2. May resume full activity with no restrictions.  3. Discontinue using post op shoe.  4. Recommended good shoe gear.  5. Return to clinic as needed.    Edrick Kins, DPM [facility]  Dr. Edrick Kins, [place]                                        [place], [place] [address]                Office [phone]  Fax [phone]

## 2018-11-05 ENCOUNTER — Encounter: Payer: Self-pay | Admitting: Podiatry

## 2018-11-05 ENCOUNTER — Other Ambulatory Visit: Payer: Self-pay

## 2018-11-05 ENCOUNTER — Ambulatory Visit (INDEPENDENT_AMBULATORY_CARE_PROVIDER_SITE_OTHER): Payer: Medicare Other | Admitting: Podiatry

## 2018-11-05 DIAGNOSIS — Z9889 Other specified postprocedural states: Secondary | ICD-10-CM

## 2018-11-05 NOTE — Progress Notes (Signed)
She presents today for postop visit date of surgery 09/26/2018 about 6 weeks status post removal hardware and partial phalangectomy to distal aspect of the fourth toe left foot.  She states that I have a little hard spot that is tender when I walk.  Objective: Vital signs are stable alert and oriented x3.  Pulses are palpable.  There appears to be a small little stitch abscess area to the distal lateral aspect of the tip of the toe left.  No open lesions or wounds are noted.  Assessment: Reactive hyperkeratotic lesion.  Plan: I debrided the area today follow-up with her as needed.

## 2018-12-03 ENCOUNTER — Ambulatory Visit (INDEPENDENT_AMBULATORY_CARE_PROVIDER_SITE_OTHER): Payer: Medicare Other | Admitting: Podiatry

## 2018-12-03 ENCOUNTER — Other Ambulatory Visit: Payer: Self-pay

## 2018-12-03 ENCOUNTER — Encounter: Payer: Self-pay | Admitting: Podiatry

## 2018-12-03 ENCOUNTER — Ambulatory Visit (INDEPENDENT_AMBULATORY_CARE_PROVIDER_SITE_OTHER): Payer: Medicare Other

## 2018-12-03 DIAGNOSIS — Q828 Other specified congenital malformations of skin: Secondary | ICD-10-CM | POA: Diagnosis not present

## 2018-12-03 DIAGNOSIS — Z4889 Encounter for other specified surgical aftercare: Secondary | ICD-10-CM | POA: Diagnosis not present

## 2018-12-03 DIAGNOSIS — T847XXD Infection and inflammatory reaction due to other internal orthopedic prosthetic devices, implants and grafts, subsequent encounter: Secondary | ICD-10-CM

## 2018-12-03 DIAGNOSIS — Z9889 Other specified postprocedural states: Secondary | ICD-10-CM

## 2018-12-03 NOTE — Progress Notes (Signed)
She presents today date of surgery 09/26/2018 removal hardware fourth toe left foot states that the toe is still sore I got a hard place over here on the side that is developing a callus.  Objective: Vital signs are stable she is alert and oriented x3/4 toe appears to be rectus there is an area of reactive hyperkeratosis with mild erythema surrounding it it appears that is being rubbed as she is ambulating.  Radiographs taken today demonstrate no osseous abnormalities in that area that she is complaining of pain.  No retention of foreign body.  Assessment: Reactive hyperkeratotic lesion distal lateral aspect fourth toe left.  Plan: Debrided the area today placed padding she will follow-up with [place] on an as-needed basis.

## 2019-01-26 ENCOUNTER — Other Ambulatory Visit: Payer: Self-pay | Admitting: Internal Medicine

## 2019-01-26 DIAGNOSIS — Z1231 Encounter for screening mammogram for malignant neoplasm of breast: Secondary | ICD-10-CM

## 2019-03-20 ENCOUNTER — Ambulatory Visit
Admission: RE | Admit: 2019-03-20 | Discharge: 2019-03-20 | Disposition: A | Discharge status: Home / Self Care | Payer: Medicare PPO | Source: Ambulatory Visit | Attending: Internal Medicine | Admitting: Internal Medicine

## 2019-03-20 DIAGNOSIS — Z1231 Encounter for screening mammogram for malignant neoplasm of breast: Secondary | ICD-10-CM

## 2019-08-24 ENCOUNTER — Other Ambulatory Visit: Payer: Self-pay | Admitting: Internal Medicine

## 2019-08-24 DIAGNOSIS — R42 Dizziness and giddiness: Secondary | ICD-10-CM

## 2019-09-03 ENCOUNTER — Other Ambulatory Visit: Payer: Self-pay

## 2019-09-03 ENCOUNTER — Ambulatory Visit
Admission: RE | Admit: 2019-09-03 | Discharge: 2019-09-03 | Disposition: A | Discharge status: Home / Self Care | Payer: Medicare PPO | Source: Ambulatory Visit | Attending: Internal Medicine | Admitting: Internal Medicine

## 2019-09-03 DIAGNOSIS — R42 Dizziness and giddiness: Secondary | ICD-10-CM | POA: Insufficient documentation

## 2020-02-10 ENCOUNTER — Other Ambulatory Visit: Payer: Self-pay | Admitting: Internal Medicine

## 2020-02-10 DIAGNOSIS — Z1231 Encounter for screening mammogram for malignant neoplasm of breast: Secondary | ICD-10-CM

## 2020-02-15 ENCOUNTER — Telehealth: Payer: Self-pay | Admitting: Surgery

## 2020-02-15 NOTE — Progress Notes (Signed)
Addendum 02/15/20  1920  02/15/20 1952  TOC ED Mini Assessment  TOC Time spent with patient (minutes): 60  PING Used in TOC Assessment No  Admission or Readmission Diverted No  What brought you to the Emergency Department?  [facility] ED CM received call from RN at [facility] concerning patient who was discharged home on oxygen with portable tank not receiving the concentrator

## 2020-02-15 NOTE — Progress Notes (Signed)
   02/15/20 2013  TOC ED Mini Assessment  TOC Time spent with patient (minutes):  (documented in the wrong chart)  PING Used in TOC Assessment  (documented in the wrong chart)  What brought you to the Emergency Department?   (documented in the wrong chart 02/15/20)

## 2020-02-22 ENCOUNTER — Ambulatory Visit: Payer: Medicare PPO | Admitting: Podiatry

## 2020-02-22 ENCOUNTER — Encounter: Payer: Self-pay | Admitting: Podiatry

## 2020-02-22 ENCOUNTER — Other Ambulatory Visit: Payer: Self-pay

## 2020-02-22 DIAGNOSIS — D2372 Other benign neoplasm of skin of left lower limb, including hip: Secondary | ICD-10-CM | POA: Diagnosis not present

## 2020-02-22 DIAGNOSIS — D2371 Other benign neoplasm of skin of right lower limb, including hip: Secondary | ICD-10-CM | POA: Diagnosis not present

## 2020-02-22 NOTE — Progress Notes (Signed)
She presents today chief complaint of painful lesion to the plantar aspect of the forefoot.  Objective: Vital signs are stable alert oriented x3.  Reactive hyperkeratotic lesions plantar aspect of the bilateral foot no open lesions or wounds.  Assessment: Pain in limb secondary to benign soft tissue lesion.  Plan: Debridement of benign soft tissue lesion.

## 2020-04-06 ENCOUNTER — Ambulatory Visit
Admission: RE | Admit: 2020-04-06 | Discharge: 2020-04-06 | Disposition: A | Discharge status: Home / Self Care | Payer: Medicare PPO | Source: Ambulatory Visit | Attending: Internal Medicine | Admitting: Internal Medicine

## 2020-04-06 ENCOUNTER — Other Ambulatory Visit: Payer: Self-pay

## 2020-04-06 DIAGNOSIS — Z1231 Encounter for screening mammogram for malignant neoplasm of breast: Secondary | ICD-10-CM | POA: Insufficient documentation

## 2021-01-11 ENCOUNTER — Ambulatory Visit: Admit: 2021-01-11 | Payer: Medicare PPO | Admitting: Ophthalmology

## 2021-01-11 SURGERY — PHACOEMULSIFICATION, CATARACT, WITH IOL INSERTION
Anesthesia: Topical | Laterality: Left

## 2021-01-25 ENCOUNTER — Ambulatory Visit: Admit: 2021-01-25 | Payer: Medicare PPO | Admitting: Ophthalmology

## 2021-01-25 SURGERY — PHACOEMULSIFICATION, CATARACT, WITH IOL INSERTION
Anesthesia: Topical | Laterality: Right

## 2021-02-06 DIAGNOSIS — N1831 Chronic kidney disease, stage 3a: Secondary | ICD-10-CM | POA: Insufficient documentation

## 2021-03-01 ENCOUNTER — Other Ambulatory Visit: Payer: Self-pay | Admitting: Internal Medicine

## 2021-03-01 DIAGNOSIS — Z1231 Encounter for screening mammogram for malignant neoplasm of breast: Secondary | ICD-10-CM

## 2021-04-07 ENCOUNTER — Other Ambulatory Visit: Payer: Self-pay

## 2021-04-07 ENCOUNTER — Ambulatory Visit
Admission: RE | Admit: 2021-04-07 | Discharge: 2021-04-07 | Disposition: A | Discharge status: Home / Self Care | Payer: Medicare PPO | Source: Ambulatory Visit | Attending: Internal Medicine | Admitting: Internal Medicine

## 2021-04-07 DIAGNOSIS — Z1231 Encounter for screening mammogram for malignant neoplasm of breast: Secondary | ICD-10-CM | POA: Insufficient documentation

## 2021-09-26 ENCOUNTER — Encounter: Payer: Self-pay | Admitting: Ophthalmology

## 2021-10-02 NOTE — Discharge Instructions (Signed)

## 2021-10-04 ENCOUNTER — Ambulatory Visit (AMBULATORY_SURGERY_CENTER): Payer: Medicare PPO | Admitting: Anesthesiology

## 2021-10-04 ENCOUNTER — Ambulatory Visit
Admission: RE | Admit: 2021-10-04 | Discharge: 2021-10-04 | Disposition: A | Discharge status: Home / Self Care | Payer: Medicare PPO | Attending: Ophthalmology | Admitting: Ophthalmology

## 2021-10-04 ENCOUNTER — Encounter
Admission: RE | Disposition: A | Discharge status: Home / Self Care | Payer: Self-pay | Source: Home / Self Care | Attending: Ophthalmology

## 2021-10-04 ENCOUNTER — Other Ambulatory Visit: Payer: Self-pay

## 2021-10-04 ENCOUNTER — Encounter: Payer: Self-pay | Admitting: Ophthalmology

## 2021-10-04 ENCOUNTER — Ambulatory Visit: Payer: Medicare PPO | Admitting: Anesthesiology

## 2021-10-04 DIAGNOSIS — I10 Essential (primary) hypertension: Secondary | ICD-10-CM | POA: Diagnosis not present

## 2021-10-04 DIAGNOSIS — K219 Gastro-esophageal reflux disease without esophagitis: Secondary | ICD-10-CM | POA: Insufficient documentation

## 2021-10-04 DIAGNOSIS — H2512 Age-related nuclear cataract, left eye: Secondary | ICD-10-CM | POA: Diagnosis present

## 2021-10-04 HISTORY — PX: CATARACT EXTRACTION W/PHACO: SHX586

## 2021-10-04 SURGERY — PHACOEMULSIFICATION, CATARACT, WITH IOL INSERTION
Anesthesia: Monitor Anesthesia Care | Site: Eye | Laterality: Left

## 2021-10-04 MED ORDER — SIGHTPATH DOSE#1 NA HYALUR & NA CHOND-NA HYALUR IO KIT
PACK | INTRAOCULAR | Status: DC | PRN
  Administered 2021-10-04: 1 via OPHTHALMIC

## 2021-10-04 MED ORDER — SIGHTPATH DOSE#1 BSS IO SOLN
INTRAOCULAR | Status: DC | PRN
  Administered 2021-10-04: 15 mL

## 2021-10-04 MED ORDER — CEFUROXIME OPHTHALMIC INJECTION 1 MG/0.1 ML
INJECTION | OPHTHALMIC | Status: DC | PRN
  Administered 2021-10-04: 0.1 mL via INTRACAMERAL

## 2021-10-04 MED ORDER — SIGHTPATH DOSE#1 BSS IO SOLN
INTRAOCULAR | Status: DC | PRN
  Administered 2021-10-04: 1 mL via INTRAMUSCULAR

## 2021-10-04 MED ORDER — FENTANYL CITRATE (PF) 100 MCG/2ML IJ SOLN
INTRAMUSCULAR | Status: DC | PRN
  Administered 2021-10-04: 50 ug via INTRAVENOUS

## 2021-10-04 MED ORDER — BRIMONIDINE TARTRATE-TIMOLOL 0.2-0.5 % OP SOLN
OPHTHALMIC | Status: DC | PRN
  Administered 2021-10-04: 1 [drp] via OPHTHALMIC

## 2021-10-04 MED ORDER — ARMC OPHTHALMIC DILATING DROPS
1.0000 | OPHTHALMIC | Status: DC | PRN
  Administered 2021-10-04 (×3): 1 via OPHTHALMIC

## 2021-10-04 MED ORDER — MIDAZOLAM HCL 2 MG/2ML IJ SOLN
INTRAMUSCULAR | Status: DC | PRN
  Administered 2021-10-04: 1 mg via INTRAVENOUS

## 2021-10-04 MED ORDER — SIGHTPATH DOSE#1 BSS IO SOLN
INTRAOCULAR | Status: DC | PRN
  Administered 2021-10-04: 57 mL via OPHTHALMIC

## 2021-10-04 MED ORDER — TETRACAINE HCL 0.5 % OP SOLN
1.0000 [drp] | OPHTHALMIC | Status: DC | PRN
  Administered 2021-10-04 (×3): 1 [drp] via OPHTHALMIC

## 2021-10-04 SURGICAL SUPPLY — 12 items
CATARACT SUITE SIGHTPATH (MISCELLANEOUS) ×2 IMPLANT
FEE CATARACT SUITE SIGHTPATH (MISCELLANEOUS) ×1 IMPLANT
GLOVE SRG 8 PF TXTR STRL LF DI (GLOVE) ×1 IMPLANT
GLOVE SURG ENC TEXT LTX SZ7.5 (GLOVE) ×2 IMPLANT
GLOVE SURG UNDER POLY LF SZ8 (GLOVE) ×2
LENS IOL DIOP 20.0 (Intraocular Lens) ×2 IMPLANT
LENS IOL TECNIS MONO 20.0 (Intraocular Lens) IMPLANT
NDL FILTER BLUNT 18X1 1/2 (NEEDLE) ×1 IMPLANT
NEEDLE FILTER BLUNT 18X 1/2SAF (NEEDLE) ×1
NEEDLE FILTER BLUNT 18X1 1/2 (NEEDLE) ×1 IMPLANT
SYR 3ML LL SCALE MARK (SYRINGE) ×2 IMPLANT
WATER STERILE IRR 250ML POUR (IV SOLUTION) ×2 IMPLANT

## 2021-10-04 NOTE — Anesthesia Postprocedure Evaluation (Signed)
Anesthesia Post Note  Patient: Jacqueline Carney  Procedure(s) Performed: CATARACT EXTRACTION PHACO AND INTRAOCULAR LENS PLACEMENT (IOC) LEFT 6.09 00:49.9 (Left: Eye)     Anesthesia Post Evaluation No notable events documented.  Molli Barrows

## 2021-10-04 NOTE — Op Note (Signed)
OPERATIVE NOTE  Jacqueline Carney [id number] 10/04/2021   PREOPERATIVE DIAGNOSIS:  Nuclear sclerotic cataract left eye. H25.12   POSTOPERATIVE DIAGNOSIS:    Nuclear sclerotic cataract left eye.     PROCEDURE:  Phacoemusification with posterior chamber intraocular lens placement of the left eye  Ultrasound time: Procedure(s): CATARACT EXTRACTION PHACO AND INTRAOCULAR LENS PLACEMENT (IOC) LEFT 6.09 00:49.9 (Left)  LENS:   Implant Name Type Inv. Item Serial No. Manufacturer Lot No. LRB No. Used Action  LENS IOL DIOP 20.0 - [id number] Intraocular Lens LENS IOL DIOP 20.0 [id number] [employer]  Left 1 Implanted      SURGEON:  Wyonia Hough, MD   ANESTHESIA:  Topical with tetracaine drops and 2% Xylocaine jelly, augmented with 1% preservative-free intracameral lidocaine.    COMPLICATIONS:  None.   DESCRIPTION OF PROCEDURE:  The patient was identified in the holding room and transported to the operating room and placed in the supine position under the operating microscope.  The left eye was identified as the operative eye and it was prepped and draped in the usual sterile ophthalmic fashion.   A 1 millimeter clear-corneal paracentesis was made at the 1:30 position.  0.5 ml of preservative-free 1% lidocaine was injected into the anterior chamber.  The anterior chamber was filled with Viscoat viscoelastic.  A 2.4 millimeter keratome was used to make a near-clear corneal incision at the 10:30 position.  .  A curvilinear capsulorrhexis was made with a cystotome and capsulorrhexis forceps.  Balanced salt solution was used to hydrodissect and hydrodelineate the nucleus.   Phacoemulsification was then used in stop and chop fashion to remove the lens nucleus and epinucleus.  The remaining cortex was then removed using the irrigation and aspiration handpiece. Provisc was then placed into the capsular bag to distend it for lens placement.  A lens was then injected into the capsular bag.  The  remaining viscoelastic was aspirated.   Wounds were hydrated with balanced salt solution.  The anterior chamber was inflated to a physiologic pressure with balanced salt solution.  No wound leaks were noted. Cefuroxime 0.1 ml of a '10mg'$ /ml solution was injected into the anterior chamber for a dose of 1 mg of intracameral antibiotic at the completion of the case.   Timolol and Brimonidine drops were applied to the eye.  The patient was taken to the recovery room in stable condition without complications of anesthesia or surgery.  Jacqueline Carney 10/04/2021, 10:13 AM

## 2021-10-04 NOTE — Anesthesia Preprocedure Evaluation (Addendum)
Anesthesia Evaluation  Patient identified by MRN, date of birth, ID band Patient awake    Reviewed: Allergy & Precautions, H&P , NPO status , Patient's Chart, lab work & pertinent test results, reviewed documented beta blocker date and time   Airway Mallampati: II  TM Distance: >3 FB Neck ROM: full    Dental no notable dental hx. (+) Teeth Intact   Pulmonary neg pulmonary ROS,    Pulmonary exam normal breath sounds clear to auscultation       Cardiovascular Exercise Tolerance: Good hypertension, On Medications negative cardio ROS Normal cardiovascular exam Rhythm:regular Rate:Normal     Neuro/Psych negative neurological ROS  negative psych ROS   GI/Hepatic negative GI ROS, Neg liver ROS, GERD  Medicated,  Endo/Other  negative endocrine ROS  Renal/GU negative Renal ROS  negative genitourinary   Musculoskeletal   Abdominal   Peds  Hematology negative hematology ROS (+)   Anesthesia Other Findings   Reproductive/Obstetrics negative OB ROS                            Anesthesia Physical Anesthesia Plan  ASA: 3  Anesthesia Plan: MAC   Post-op Pain Management:    Induction:   PONV Risk Score and Plan:   Airway Management Planned:   Additional Equipment:   Intra-op Plan:   Post-operative Plan:   Informed Consent: I have reviewed the patients History and Physical, chart, labs and discussed the procedure including the risks, benefits and alternatives for the proposed anesthesia with the patient or authorized representative who has indicated his/her understanding and acceptance.       Plan Discussed with: CRNA  Anesthesia Plan Comments:        Anesthesia Quick Evaluation

## 2021-10-04 NOTE — Anesthesia Postprocedure Evaluation (Signed)
Anesthesia Post Note  Patient: STEPHAIE Carney  Procedure(s) Performed: CATARACT EXTRACTION PHACO AND INTRAOCULAR LENS PLACEMENT (IOC) LEFT 6.09 00:49.9 (Left: Eye)     Patient location during evaluation: PACU Anesthesia Type: MAC Level of consciousness: awake and alert Pain management: pain level controlled Vital Signs Assessment: post-procedure vital signs reviewed and stable Respiratory status: spontaneous breathing, nonlabored ventilation, respiratory function stable and patient connected to nasal cannula oxygen Cardiovascular status: stable and blood pressure returned to baseline Postop Assessment: no apparent nausea or vomiting Anesthetic complications: no   No notable events documented.  Molli Barrows

## 2021-10-04 NOTE — Anesthesia Postprocedure Evaluation (Signed)
Anesthesia Post Note  Patient: Jacqueline Carney  Procedure(s) Performed: CATARACT EXTRACTION PHACO AND INTRAOCULAR LENS PLACEMENT (IOC) LEFT 6.09 00:49.9 (Left: Eye)     Patient location during evaluation: PACU Anesthesia Type: MAC Level of consciousness: awake and alert Pain management: pain level controlled Vital Signs Assessment: post-procedure vital signs reviewed and stable Respiratory status: spontaneous breathing, nonlabored ventilation, respiratory function stable and patient connected to nasal cannula oxygen Cardiovascular status: stable and blood pressure returned to baseline Postop Assessment: no apparent nausea or vomiting Anesthetic complications: no   No notable events documented.  Adriene Knipfer G Ruston Fedora      

## 2021-10-04 NOTE — H&P (Signed)
[facility]   Primary Care Physician:  Adin Hector, MD Ophthalmologist: Dr. Leandrew Koyanagi  Pre-Procedure History & Physical: HPI:  Jacqueline Carney is a 88 y.o. female here for ophthalmic surgery.   Past Medical History:  Diagnosis Date   Bladder incontinence    Cancer ([place])    skin   GERD (gastroesophageal reflux disease)    H/O degenerative disc disease    Hypercholesterolemia    Hypertension    Palpitations 09/2010   stress echo negative for ischemia, holter without sigmificant abnormality   Vascular ectasia of colon    Vitamin D deficiency, unspecified     Past Surgical History:  Procedure Laterality Date   ABDOMINAL HYSTERECTOMY     BACK SURGERY     CHOLECYSTECTOMY     COLONOSCOPY     with colon polyps   COLONOSCOPY WITH PROPOFOL N/A 07/26/2017   Procedure: COLONOSCOPY WITH PROPOFOL;  Surgeon: Manya Silvas, MD;  Location: [facility] ENDOSCOPY;  Service: Endoscopy;  Laterality: N/A;    Prior to Admission medications   Medication Sig Start Date End Date Taking? Authorizing Provider  alendronate (FOSAMAX) 70 MG tablet Take 70 mg by mouth once a week. 12/26/19  Yes [provider]  Calcium Carbonate-Vitamin D 600-400 MG-UNIT tablet Take 1 tablet by mouth daily.   Yes [provider]  Cholecalciferol 50 MCG (2000 UT) TABS Take by mouth.   Yes [provider]  cyanocobalamin 1000 MCG tablet Take 1,000 mcg by mouth daily.   Yes [provider]  docusate sodium (COLACE) 100 MG capsule Take 100 mg by mouth daily.   Yes [provider]  fluorouracil (ADRUCIL) 500 MG/10ML SOLN '50mg'$ /ml bring with you to dermatologist office 11/28/16  Yes [provider]  fluorouracil (EFUDEX) 5 % cream Apply topically.   Yes [provider]  mupirocin ointment (BACTROBAN) 2 % Apply topically. 01/30/17  Yes [provider]  pantoprazole (PROTONIX) 40 MG tablet Take 40 mg by mouth daily.   Yes [provider]  ondansetron (ZOFRAN) 4 MG tablet Take 1 tablet (4 mg total) by mouth every 8 (eight) hours as needed. 09/25/18   Hyatt, Max T, DPM  triamcinolone acetonide (KENALOG) 10 MG/ML injection by Intra-Lesional route. 01/02/17   [provider]    Allergies as of 07/25/2021   (No Known Allergies)    Family History  Problem Relation Age of Onset   Breast cancer Neg Hx     Social History   Socioeconomic History   Marital status: Widowed    Spouse name: Not on file   Number of children: Not on file   Years of education: Not on file   Highest education level: Not on file  Occupational History   Not on file  Tobacco Use   Smoking status: Never   Smokeless tobacco: Never  Vaping Use   Vaping Use: Never used  Substance and Sexual Activity   Alcohol use: Never   Drug use: Never   Sexual activity: Not on file  Other Topics Concern   Not on file  Social History Narrative   Not on file   Social Determinants of Health   Financial Resource Strain: Not on file  Food Insecurity: Not on file  Transportation Needs: Not on file  Physical Activity: Not on file  Stress: Not on file  Social Connections: Not on file  Intimate Partner Violence: Not on file    Review of Systems: See HPI, otherwise negative ROS  Physical Exam: BP (!) 175/96   Pulse 86   Temp (!) 97.3 F (36.3 C) (Temporal)   Ht '5\' 5"'$  (1.651 m)   Wt 86.6 kg   SpO2 92%   BMI 31.78 kg/m  General:   Alert,  pleasant and cooperative in NAD Head:  Normocephalic and atraumatic. Lungs:  Clear to auscultation.    Heart:  Regular rate and rhythm.   Impression/Plan: Jacqueline Carney is here for ophthalmic surgery.  Risks, benefits, limitations, and alternatives regarding ophthalmic surgery have been reviewed with the patient.  Questions have been answered.  All parties agreeable.   Leandrew Koyanagi, MD  10/04/2021, 9:19 AM

## 2021-10-04 NOTE — Transfer of Care (Signed)
Immediate Anesthesia Transfer of Care Note  Patient: Jacqueline Carney  Procedure(s) Performed: CATARACT EXTRACTION PHACO AND INTRAOCULAR LENS PLACEMENT (IOC) LEFT 6.09 00:49.9 (Left: Eye)  Patient Location: PACU  Anesthesia Type:MAC  Level of Consciousness: awake, alert  and oriented  Airway & Oxygen Therapy: Patient Spontanous Breathing  Post-op Assessment: Report given to RN and Post -op Vital signs reviewed and stable  Post vital signs: Reviewed  Last Vitals:  Vitals Value Taken Time  BP    Temp    Pulse    Resp 13 10/04/21 1015  SpO2    Vitals shown include unvalidated device data.  Last Pain:  Vitals:   10/04/21 0859  TempSrc: Temporal  PainSc: 0-No pain         Complications: No notable events documented.

## 2021-10-05 ENCOUNTER — Encounter: Payer: Self-pay | Admitting: Ophthalmology

## 2021-10-16 NOTE — Discharge Instructions (Signed)

## 2021-10-18 ENCOUNTER — Ambulatory Visit (AMBULATORY_SURGERY_CENTER): Payer: Medicare PPO | Admitting: Anesthesiology

## 2021-10-18 ENCOUNTER — Ambulatory Visit: Payer: Medicare PPO | Admitting: Anesthesiology

## 2021-10-18 ENCOUNTER — Other Ambulatory Visit: Payer: Self-pay

## 2021-10-18 ENCOUNTER — Encounter
Admission: RE | Disposition: A | Discharge status: Home / Self Care | Payer: Self-pay | Source: Home / Self Care | Attending: Ophthalmology

## 2021-10-18 ENCOUNTER — Encounter: Payer: Self-pay | Admitting: Ophthalmology

## 2021-10-18 ENCOUNTER — Ambulatory Visit
Admission: RE | Admit: 2021-10-18 | Discharge: 2021-10-18 | Disposition: A | Discharge status: Home / Self Care | Payer: Medicare PPO | Attending: Ophthalmology | Admitting: Ophthalmology

## 2021-10-18 DIAGNOSIS — Z85828 Personal history of other malignant neoplasm of skin: Secondary | ICD-10-CM | POA: Insufficient documentation

## 2021-10-18 DIAGNOSIS — H2511 Age-related nuclear cataract, right eye: Secondary | ICD-10-CM

## 2021-10-18 DIAGNOSIS — E7800 Pure hypercholesterolemia, unspecified: Secondary | ICD-10-CM | POA: Insufficient documentation

## 2021-10-18 DIAGNOSIS — I10 Essential (primary) hypertension: Secondary | ICD-10-CM

## 2021-10-18 DIAGNOSIS — K219 Gastro-esophageal reflux disease without esophagitis: Secondary | ICD-10-CM | POA: Diagnosis not present

## 2021-10-18 HISTORY — PX: CATARACT EXTRACTION W/PHACO: SHX586

## 2021-10-18 SURGERY — PHACOEMULSIFICATION, CATARACT, WITH IOL INSERTION
Anesthesia: Monitor Anesthesia Care | Site: Eye | Laterality: Right

## 2021-10-18 MED ORDER — TETRACAINE HCL 0.5 % OP SOLN
1.0000 [drp] | OPHTHALMIC | Status: DC | PRN
  Administered 2021-10-18 (×3): 1 [drp] via OPHTHALMIC

## 2021-10-18 MED ORDER — CEFUROXIME OPHTHALMIC INJECTION 1 MG/0.1 ML
INJECTION | OPHTHALMIC | Status: DC | PRN
  Administered 2021-10-18: 0.1 mL via INTRACAMERAL

## 2021-10-18 MED ORDER — FENTANYL CITRATE (PF) 100 MCG/2ML IJ SOLN
INTRAMUSCULAR | Status: DC | PRN
  Administered 2021-10-18 (×2): 50 ug via INTRAVENOUS

## 2021-10-18 MED ORDER — SIGHTPATH DOSE#1 BSS IO SOLN
INTRAOCULAR | Status: DC | PRN
  Administered 2021-10-18: 15 mL

## 2021-10-18 MED ORDER — MIDAZOLAM HCL 5 MG/5ML IJ SOLN
INTRAMUSCULAR | Status: DC | PRN
  Administered 2021-10-18: 1 mg via INTRAVENOUS

## 2021-10-18 MED ORDER — ARMC OPHTHALMIC DILATING DROPS
1.0000 | OPHTHALMIC | Status: DC | PRN
  Administered 2021-10-18 (×3): 1 via OPHTHALMIC

## 2021-10-18 MED ORDER — SIGHTPATH DOSE#1 BSS IO SOLN
INTRAOCULAR | Status: DC | PRN
  Administered 2021-10-18: 74 mL via OPHTHALMIC

## 2021-10-18 MED ORDER — LACTATED RINGERS IV SOLN: INTRAVENOUS | Status: DC

## 2021-10-18 MED ORDER — SIGHTPATH DOSE#1 NA HYALUR & NA CHOND-NA HYALUR IO KIT
PACK | INTRAOCULAR | Status: DC | PRN
  Administered 2021-10-18: 1 via OPHTHALMIC

## 2021-10-18 MED ORDER — BRIMONIDINE TARTRATE-TIMOLOL 0.2-0.5 % OP SOLN
OPHTHALMIC | Status: DC | PRN
  Administered 2021-10-18: 1 [drp] via OPHTHALMIC

## 2021-10-18 MED ORDER — SIGHTPATH DOSE#1 BSS IO SOLN
INTRAOCULAR | Status: DC | PRN
  Administered 2021-10-18: 1 mL

## 2021-10-18 SURGICAL SUPPLY — 23 items
CANNULA ANT/CHMB 27G (MISCELLANEOUS) IMPLANT
CANNULA ANT/CHMB 27GA (MISCELLANEOUS) IMPLANT
CATARACT SUITE SIGHTPATH (MISCELLANEOUS) ×2 IMPLANT
FEE CATARACT SUITE SIGHTPATH (MISCELLANEOUS) ×1 IMPLANT
GLOVE SRG 8 PF TXTR STRL LF DI (GLOVE) ×1 IMPLANT
GLOVE SURG ENC TEXT LTX SZ7.5 (GLOVE) ×2 IMPLANT
GLOVE SURG GAMMEX PI TX LF 7.5 (GLOVE) IMPLANT
GLOVE SURG UNDER POLY LF SZ8 (GLOVE) ×2
LENS IOL TECNIS EYHANCE 21.0 (Intraocular Lens) ×1 IMPLANT
NDL FILTER BLUNT 18X1 1/2 (NEEDLE) ×1 IMPLANT
NDL RETROBULBAR .5 NSTRL (NEEDLE) IMPLANT
NEEDLE FILTER BLUNT 18X 1/2SAF (NEEDLE) ×1
NEEDLE FILTER BLUNT 18X1 1/2 (NEEDLE) ×1 IMPLANT
PACK VIT ANT 23G (MISCELLANEOUS) IMPLANT
RING MALYGIN 7.0 (MISCELLANEOUS) IMPLANT
STRIP CLOSURE SKIN 1/2X4 (GAUZE/BANDAGES/DRESSINGS) ×1 IMPLANT
SUT ETHILON 10-0 CS-B-6CS-B-6 (SUTURE)
SUT VICRYL  9 0 (SUTURE)
SUT VICRYL 9 0 (SUTURE) IMPLANT
SUTURE EHLN 10-0 CS-B-6CS-B-6 (SUTURE) IMPLANT
SYR 3ML LL SCALE MARK (SYRINGE) ×2 IMPLANT
WATER STERILE IRR 250ML POUR (IV SOLUTION) ×2 IMPLANT
WICK EYE OCUCEL (MISCELLANEOUS) ×1 IMPLANT

## 2021-10-18 NOTE — Op Note (Addendum)
LOCATION:  [facility]   PREOPERATIVE DIAGNOSIS:    Nuclear sclerotic cataract right eye. H25.11   POSTOPERATIVE DIAGNOSIS:  Nuclear sclerotic cataract right eye.     PROCEDURE:  Phacoemusification with posterior chamber intraocular lens placement of the right eye   ULTRASOUND TIME: Procedure(s) with comments: CATARACT EXTRACTION PHACO AND INTRAOCULAR LENS PLACEMENT (IOC) RIGHT (Right) - 10.59 1:23.2  LENS:   Implant Name Type Inv. Item Serial No. Manufacturer Lot No. LRB No. Used Action  LENS IOL TECNIS EYHANCE 21.0 - [id number] Intraocular Lens LENS IOL TECNIS EYHANCE 21.0 [id number] [employer]  Right 1 Implanted         SURGEON:  Wyonia Hough, MD   ANESTHESIA:  Topical with tetracaine drops and 2% Xylocaine jelly, augmented with 1% preservative-free intracameral lidocaine.    COMPLICATIONS:  None.   DESCRIPTION OF PROCEDURE:  The patient was identified in the holding room and transported to the operating room and placed in the supine position under the operating microscope.  The right eye was identified as the operative eye and it was prepped and draped in the usual sterile ophthalmic fashion.   A 1 millimeter clear-corneal paracentesis was made at the 12:00 position.  0.5 ml of preservative-free 1% lidocaine was injected into the anterior chamber. The anterior chamber was filled with Viscoat viscoelastic.  A 2.4 millimeter keratome was used to make a near-clear corneal incision at the 9:00 position.  A curvilinear capsulorrhexis was made with a cystotome and capsulorrhexis forceps.  Balanced salt solution was used to hydrodissect and hydrodelineate the nucleus.   Phacoemulsification was then used in stop and chop fashion to remove the lens nucleus and epinucleus.  The remaining cortex was then removed using the irrigation and aspiration handpiece. Provisc was then placed into the capsular bag to distend it for lens placement.  A lens was then injected into the  capsular bag.  The remaining viscoelastic was aspirated.   Wounds were hydrated with balanced salt solution.  The anterior chamber was inflated to a physiologic pressure with balanced salt solution.  No wound leaks were noted. Cefuroxime 0.1 ml of a '10mg'$ /ml solution was injected into the anterior chamber for a dose of 1 mg of intracameral antibiotic at the completion of the case.   Timolol and Brimonidine drops were applied to the eye.  The patient was taken to the recovery room in stable condition without complications of anesthesia or surgery.   Demaurion Dicioccio 10/18/2021, 10:54 AM

## 2021-10-18 NOTE — Transfer of Care (Signed)
Immediate Anesthesia Transfer of Care Note  Patient: Jacqueline Carney  Procedure(s) Performed: CATARACT EXTRACTION PHACO AND INTRAOCULAR LENS PLACEMENT (IOC) RIGHT (Right: Eye)  Patient Location: PACU  Anesthesia Type: MAC  Level of Consciousness: awake, alert  and patient cooperative  Airway and Oxygen Therapy: Patient Spontanous Breathing and Patient connected to supplemental oxygen  Post-op Assessment: Post-op Vital signs reviewed, Patient's Cardiovascular Status Stable, Respiratory Function Stable, Patent Airway and No signs of Nausea or vomiting  Post-op Vital Signs: Reviewed and stable  Complications: There were no known notable events for this encounter.

## 2021-10-18 NOTE — Anesthesia Postprocedure Evaluation (Signed)
Anesthesia Post Note  Patient: Jacqueline Carney  Procedure(s) Performed: CATARACT EXTRACTION PHACO AND INTRAOCULAR LENS PLACEMENT (IOC) RIGHT (Right: Eye)     Patient location during evaluation: PACU Anesthesia Type: MAC Level of consciousness: awake and alert Pain management: pain level controlled Vital Signs Assessment: post-procedure vital signs reviewed and stable Respiratory status: spontaneous breathing, nonlabored ventilation, respiratory function stable and patient connected to nasal cannula oxygen Cardiovascular status: stable and blood pressure returned to baseline Postop Assessment: no apparent nausea or vomiting Anesthetic complications: no   There were no known notable events for this encounter.  Martha Clan

## 2021-10-18 NOTE — Anesthesia Preprocedure Evaluation (Signed)
Anesthesia Evaluation  Patient identified by MRN, date of birth, ID band Patient awake    Reviewed: Allergy & Precautions, H&P , NPO status , Patient's Chart, lab work & pertinent test results, reviewed documented beta blocker date and time   History of Anesthesia Complications Negative for: history of anesthetic complications  Airway Mallampati: II  TM Distance: >3 FB Neck ROM: full    Dental no notable dental hx. (+) Teeth Intact   Pulmonary neg pulmonary ROS,    Pulmonary exam normal breath sounds clear to auscultation       Cardiovascular Exercise Tolerance: Good hypertension, On Medications (-) angina(-) Past MI and (-) Cardiac Stents Normal cardiovascular exam(-) dysrhythmias (-) Valvular Problems/Murmurs Rhythm:regular Rate:Normal     Neuro/Psych negative neurological ROS  negative psych ROS   GI/Hepatic negative GI ROS, Neg liver ROS, GERD  Medicated,  Endo/Other  negative endocrine ROS  Renal/GU negative Renal ROS  negative genitourinary   Musculoskeletal   Abdominal   Peds  Hematology negative hematology ROS (+)   Anesthesia Other Findings Past Medical History: No date: Bladder incontinence No date: Cancer [facility])     Comment:  skin No date: GERD (gastroesophageal reflux disease) No date: H/O degenerative disc disease No date: Hypercholesterolemia No date: Hypertension 09/2010: Palpitations     Comment:  stress echo negative for ischemia, holter without               sigmificant abnormality No date: Vascular ectasia of colon No date: Vitamin D deficiency, unspecified   Reproductive/Obstetrics negative OB ROS                             Anesthesia Physical  Anesthesia Plan  ASA: 3  Anesthesia Plan: MAC   Post-op Pain Management:    Induction:   PONV Risk Score and Plan:   Airway Management Planned:   Additional Equipment:   Intra-op Plan:   Post-operative  Plan:   Informed Consent: I have reviewed the patients History and Physical, chart, labs and discussed the procedure including the risks, benefits and alternatives for the proposed anesthesia with the patient or authorized representative who has indicated his/her understanding and acceptance.       Plan Discussed with: CRNA  Anesthesia Plan Comments:         Anesthesia Quick Evaluation

## 2021-10-18 NOTE — H&P (Signed)
[facility]   Primary Care Physician:  Adin Hector, MD Ophthalmologist: Dr. Leandrew Koyanagi  Pre-Procedure History & Physical: HPI:  Jacqueline Carney is a 88 y.o. female here for ophthalmic surgery.   Past Medical History:  Diagnosis Date   Bladder incontinence    Cancer ([place])    skin   GERD (gastroesophageal reflux disease)    H/O degenerative disc disease    Hypercholesterolemia    Hypertension    Palpitations 09/2010   stress echo negative for ischemia, holter without sigmificant abnormality   Vascular ectasia of colon    Vitamin D deficiency, unspecified     Past Surgical History:  Procedure Laterality Date   ABDOMINAL HYSTERECTOMY     BACK SURGERY     CATARACT EXTRACTION W/PHACO Left 10/04/2021   Procedure: CATARACT EXTRACTION PHACO AND INTRAOCULAR LENS PLACEMENT ([place]) LEFT 6.09 00:49.9;  Surgeon: Leandrew Koyanagi, MD;  Location: [place];  Service: Ophthalmology;  Laterality: Left;   CHOLECYSTECTOMY     COLONOSCOPY     with colon polyps   COLONOSCOPY WITH PROPOFOL N/A 07/26/2017   Procedure: COLONOSCOPY WITH PROPOFOL;  Surgeon: Manya Silvas, MD;  Location: [facility] ENDOSCOPY;  Service: Endoscopy;  Laterality: N/A;    Prior to Admission medications   Medication Sig Start Date End Date Taking? Authorizing Provider  alendronate (FOSAMAX) 70 MG tablet Take 70 mg by mouth once a week. 12/26/19  Yes [provider]  Calcium Carbonate-Vitamin D 600-400 MG-UNIT tablet Take 1 tablet by mouth daily.   Yes [provider]  Cholecalciferol 50 MCG (2000 UT) TABS Take by mouth.   Yes [provider]  cyanocobalamin 1000 MCG tablet Take 1,000 mcg by mouth daily.   Yes [provider]  docusate sodium (COLACE) 100 MG capsule Take 100 mg by mouth daily.   Yes [provider]  fluorouracil (ADRUCIL) 500 MG/10ML SOLN '50mg'$ /ml bring with you to dermatologist office 11/28/16  Yes [provider]   fluorouracil (EFUDEX) 5 % cream Apply topically.   Yes [provider]  mupirocin ointment (BACTROBAN) 2 % Apply topically. 01/30/17  Yes [provider]  ondansetron (ZOFRAN) 4 MG tablet Take 1 tablet (4 mg total) by mouth every 8 (eight) hours as needed. 09/25/18  Yes Hyatt, Max T, DPM  pantoprazole (PROTONIX) 40 MG tablet Take 40 mg by mouth daily.   Yes [provider]  triamcinolone acetonide (KENALOG) 10 MG/ML injection by Intra-Lesional route. 01/02/17  Yes [provider]    Allergies as of 07/25/2021   (No Known Allergies)    Family History  Problem Relation Age of Onset   Breast cancer Neg Hx     Social History   Socioeconomic History   Marital status: Widowed    Spouse name: Not on file   Number of children: Not on file   Years of education: Not on file   Highest education level: Not on file  Occupational History   Not on file  Tobacco Use   Smoking status: Never   Smokeless tobacco: Never  Vaping Use   Vaping Use: Never used  Substance and Sexual Activity   Alcohol use: Never   Drug use: Never   Sexual activity: Not on file  Other Topics Concern   Not on file  Social History Narrative   Not on file   Social Determinants of Health   Financial Resource Strain: Not on file  Food Insecurity: Not on file  Transportation Needs: Not  on file  Physical Activity: Not on file  Stress: Not on file  Social Connections: Not on file  Intimate Partner Violence: Not on file    Review of Systems: See HPI, otherwise negative ROS  Physical Exam: BP (!) 180/77   Temp 97.6 F (36.4 C) (Temporal)   Resp 16   Ht '5\' 5"'$  (1.651 m)   Wt 85.7 kg   SpO2 99%   BMI 31.45 kg/m  General:   Alert,  pleasant and cooperative in NAD Head:  Normocephalic and atraumatic. Lungs:  Clear to auscultation.    Heart:  Regular rate and rhythm.   Impression/Plan: Jacqueline Carney is here for ophthalmic surgery.  Risks, benefits,  limitations, and alternatives regarding ophthalmic surgery have been reviewed with the patient.  Questions have been answered.  All parties agreeable.   Leandrew Koyanagi, MD  10/18/2021, 9:37 AM

## 2021-10-19 ENCOUNTER — Encounter: Payer: Self-pay | Admitting: Ophthalmology

## 2022-02-07 ENCOUNTER — Encounter: Payer: Self-pay | Admitting: Podiatry

## 2022-02-07 ENCOUNTER — Ambulatory Visit: Payer: Medicare PPO | Admitting: Podiatry

## 2022-02-07 ENCOUNTER — Ambulatory Visit (INDEPENDENT_AMBULATORY_CARE_PROVIDER_SITE_OTHER): Payer: Medicare PPO

## 2022-02-07 DIAGNOSIS — M722 Plantar fascial fibromatosis: Secondary | ICD-10-CM

## 2022-02-07 MED ORDER — MELOXICAM 15 MG PO TABS: 15.0000 mg | ORAL_TABLET | Freq: Every day | ORAL | 3 refills | Status: DC

## 2022-02-07 MED ORDER — METHYLPREDNISOLONE 4 MG PO TBPK: ORAL_TABLET | ORAL | 0 refills | Status: DC

## 2022-02-07 MED ORDER — TRIAMCINOLONE ACETONIDE 40 MG/ML IJ SUSP
20.0000 mg | Freq: Once | INTRAMUSCULAR | Status: AC
  Administered 2022-02-07: 20 mg

## 2022-02-07 NOTE — Progress Notes (Signed)
Jacqueline Carney presents today chief complaint of sudden onset of heel pain x 3 days right foot.  She states she woke up Monday with severe pain she is an active walker daily and states that she had just recently increased her mileage as well as her cadence.  Objective: Vital signs are stable she alert and oriented x 3 pulses are palpable right.  She has pain on palpation medial calcaneal tubercle no pain on palpation central calcaneal tubercle there are some mild edema in the area.  No erythema no ecchymosis she has no pain on medial lateral compression of the calcaneus.  Radiographs taken today demonstrate an osseously mature foot generalized demineralization of the bone plantar distally oriented calcaneal heel spur with soft tissue increase in density at the plantar fascial calcaneal insertion site.  Assessment: Planter fasciitis right foot.  Plan: I injected the right foot today 20 mg Kenalog 5 mg Marcaine point maximal tenderness.  Started her on a Medrol Dosepak to be followed by meloxicam.  Discussed appropriate shoe gear stretching exercise ice therapy and shoe gear modifications I will follow-up with Jacqueline Carney in about 4 to 6 weeks.

## 2022-02-07 NOTE — Patient Instructions (Signed)

## 2022-02-13 DIAGNOSIS — N2581 Secondary hyperparathyroidism of renal origin: Secondary | ICD-10-CM | POA: Insufficient documentation

## 2022-02-28 ENCOUNTER — Ambulatory Visit: Payer: Medicare PPO | Admitting: Podiatry

## 2022-03-06 ENCOUNTER — Other Ambulatory Visit: Payer: Self-pay | Admitting: Internal Medicine

## 2022-03-06 DIAGNOSIS — Z1231 Encounter for screening mammogram for malignant neoplasm of breast: Secondary | ICD-10-CM

## 2022-03-14 ENCOUNTER — Ambulatory Visit: Payer: Medicare PPO | Admitting: Podiatry

## 2022-04-09 ENCOUNTER — Ambulatory Visit
Admission: RE | Admit: 2022-04-09 | Discharge: 2022-04-09 | Disposition: A | Discharge status: Home / Self Care | Payer: Medicare PPO | Source: Ambulatory Visit | Attending: Internal Medicine | Admitting: Internal Medicine

## 2022-04-09 DIAGNOSIS — Z1231 Encounter for screening mammogram for malignant neoplasm of breast: Secondary | ICD-10-CM

## 2022-05-31 DIAGNOSIS — H903 Sensorineural hearing loss, bilateral: Secondary | ICD-10-CM | POA: Insufficient documentation

## 2022-09-13 ENCOUNTER — Encounter: Payer: Self-pay | Admitting: Ophthalmology

## 2022-09-14 ENCOUNTER — Encounter: Payer: Self-pay | Admitting: Ophthalmology

## 2022-09-14 ENCOUNTER — Encounter: Payer: Self-pay | Admitting: Anesthesiology

## 2022-09-14 NOTE — Anesthesia Preprocedure Evaluation (Deleted)
Anesthesia Evaluation    Airway Mallampati: II       Dental   Pulmonary           Cardiovascular hypertension,      Neuro/Psych    GI/Hepatic   Endo/Other    Renal/GU      Musculoskeletal   Abdominal   Peds  Hematology   Anesthesia Other Findings Bladder incontinence  H/O degenerative disc disease Hypercholesterolemia Hypertension Palpitations  Vascular ectasia of colon Vitamin D deficiency, unspecified  GERD (gastroesophageal reflux disease) Cancer (HCC)  Sensorineural hearing loss both ears  Previous cataract surgery, last year   Reproductive/Obstetrics                              Anesthesia Physical Anesthesia Plan  ASA: 3  Anesthesia Plan:    Post-op Pain Management:    Induction:   PONV Risk Score and Plan:   Airway Management Planned:   Additional Equipment:   Intra-op Plan:   Post-operative Plan:   Informed Consent:   Plan Discussed with:   Anesthesia Plan Comments:          Anesthesia Quick Evaluation

## 2022-09-19 NOTE — Discharge Instructions (Signed)

## 2023-01-02 ENCOUNTER — Encounter: Payer: Self-pay | Admitting: Ophthalmology

## 2023-01-03 ENCOUNTER — Ambulatory Visit: Payer: Self-pay | Admitting: Anesthesiology

## 2023-01-03 ENCOUNTER — Encounter: Payer: Self-pay | Admitting: Ophthalmology

## 2023-01-03 ENCOUNTER — Ambulatory Visit: Payer: Medicare PPO | Admitting: Anesthesiology

## 2023-01-03 ENCOUNTER — Other Ambulatory Visit: Payer: Self-pay

## 2023-01-03 ENCOUNTER — Encounter
Admission: RE | Disposition: A | Discharge status: Home / Self Care | Payer: Self-pay | Source: Home / Self Care | Attending: Ophthalmology

## 2023-01-03 ENCOUNTER — Ambulatory Visit
Admission: RE | Admit: 2023-01-03 | Discharge: 2023-01-03 | Disposition: A | Discharge status: Home / Self Care | Payer: Medicare PPO | Attending: Ophthalmology | Admitting: Ophthalmology

## 2023-01-03 DIAGNOSIS — H02834 Dermatochalasis of left upper eyelid: Secondary | ICD-10-CM | POA: Insufficient documentation

## 2023-01-03 DIAGNOSIS — I10 Essential (primary) hypertension: Secondary | ICD-10-CM | POA: Diagnosis not present

## 2023-01-03 DIAGNOSIS — H02831 Dermatochalasis of right upper eyelid: Secondary | ICD-10-CM | POA: Diagnosis not present

## 2023-01-03 DIAGNOSIS — K219 Gastro-esophageal reflux disease without esophagitis: Secondary | ICD-10-CM | POA: Diagnosis not present

## 2023-01-03 DIAGNOSIS — Z85828 Personal history of other malignant neoplasm of skin: Secondary | ICD-10-CM | POA: Diagnosis not present

## 2023-01-03 HISTORY — DX: Sensorineural hearing loss, bilateral: H90.3

## 2023-01-03 HISTORY — PX: BROW LIFT: SHX178

## 2023-01-03 SURGERY — BLEPHAROPLASTY
Anesthesia: Monitor Anesthesia Care | Laterality: Bilateral

## 2023-01-03 MED ORDER — SODIUM CHLORIDE 0.9% FLUSH
10.0000 mL | Freq: Two times a day (BID) | INTRAVENOUS | Status: DC
  Administered 2023-01-03: 10 mL via INTRAVENOUS

## 2023-01-03 MED ORDER — FENTANYL CITRATE (PF) 100 MCG/2ML IJ SOLN
INTRAMUSCULAR | Status: DC | PRN
  Administered 2023-01-03: 25 ug via INTRAVENOUS
  Administered 2023-01-03: 50 ug via INTRAVENOUS
  Administered 2023-01-03: 25 ug via INTRAVENOUS

## 2023-01-03 MED ORDER — LIDOCAINE-EPINEPHRINE 2 %-1:100000 IJ SOLN
INTRAMUSCULAR | Status: DC | PRN
  Administered 2023-01-03: 2.2 mL via OPHTHALMIC

## 2023-01-03 MED ORDER — ERYTHROMYCIN 5 MG/GM OP OINT: TOPICAL_OINTMENT | OPHTHALMIC | 2 refills | Status: DC

## 2023-01-03 MED ORDER — TETRACAINE HCL 0.5 % OP SOLN
OPHTHALMIC | Status: DC | PRN
  Administered 2023-01-03: 2 [drp] via OPHTHALMIC

## 2023-01-03 MED ORDER — TRAMADOL HCL 50 MG PO TABS: ORAL_TABLET | ORAL | 0 refills | Status: DC

## 2023-01-03 MED ORDER — BSS IO SOLN
INTRAOCULAR | Status: DC | PRN
  Administered 2023-01-03: 15 mL

## 2023-01-03 MED ORDER — ERYTHROMYCIN 5 MG/GM OP OINT
TOPICAL_OINTMENT | OPHTHALMIC | Status: DC | PRN
  Administered 2023-01-03: 1 via OPHTHALMIC

## 2023-01-03 MED ORDER — LIDOCAINE HCL (CARDIAC) PF 100 MG/5ML IV SOSY
PREFILLED_SYRINGE | INTRAVENOUS | Status: DC | PRN
  Administered 2023-01-03: 40 mg via INTRAVENOUS

## 2023-01-03 MED ORDER — PROPOFOL 500 MG/50ML IV EMUL
INTRAVENOUS | Status: DC | PRN
  Administered 2023-01-03: 50 ug/kg/min via INTRAVENOUS

## 2023-01-03 MED ORDER — FENTANYL CITRATE (PF) 100 MCG/2ML IJ SOLN
INTRAMUSCULAR | Status: AC
  Filled 2023-01-03: qty 2

## 2023-01-03 SURGICAL SUPPLY — 21 items
APPLICATOR COTTON TIP WD 3 STR (MISCELLANEOUS) ×1 IMPLANT
BLADE SURG 15 STRL LF DISP TIS (BLADE) ×1 IMPLANT
BLADE SURG 15 STRL SS (BLADE) ×1
CORD BIP STRL DISP 12FT (MISCELLANEOUS) ×1 IMPLANT
GAUZE SPONGE 2X2 STRL 8-PLY (GAUZE/BANDAGES/DRESSINGS) ×10 IMPLANT
GAUZE SPONGE 4X4 12PLY STRL (GAUZE/BANDAGES/DRESSINGS) ×1 IMPLANT
GLOVE SURG UNDER POLY LF SZ7 (GLOVE) ×2 IMPLANT
GOWN STRL REUS W/ TWL LRG LVL3 (GOWN DISPOSABLE) ×1 IMPLANT
GOWN STRL REUS W/TWL LRG LVL3 (GOWN DISPOSABLE) ×1
MARKER SKIN XFINE TIP W/RULER (MISCELLANEOUS) ×1 IMPLANT
NDL FILTER BLUNT 18X1 1/2 (NEEDLE) ×1 IMPLANT
NDL HYPO 30X.5 LL (NEEDLE) ×2 IMPLANT
NEEDLE FILTER BLUNT 18X1 1/2 (NEEDLE) ×1
NEEDLE HYPO 30X.5 LL (NEEDLE) ×2
PACK ENT CUSTOM (PACKS) ×1 IMPLANT
SOL PREP PVP 2OZ (MISCELLANEOUS) ×1
SOLUTION PREP PVP 2OZ (MISCELLANEOUS) ×1 IMPLANT
SUT GUT PLAIN 6-0 1X18 ABS (SUTURE) ×1 IMPLANT
SYR 10ML LL (SYRINGE) ×1 IMPLANT
SYR 3ML LL SCALE MARK (SYRINGE) ×1 IMPLANT
WATER STERILE IRR 250ML POUR (IV SOLUTION) ×1 IMPLANT

## 2023-01-03 NOTE — Transfer of Care (Signed)
Immediate Anesthesia Transfer of Care Note  Patient: Jacqueline Carney  Procedure(s) Performed: BLEPHAROPLASTY UPPER EYELID; W/EXCESS SKIN BILATERAL (Bilateral)  Patient Location: PACU  Anesthesia Type: MAC  Level of Consciousness: awake, alert  and patient cooperative  Airway and Oxygen Therapy: Patient Spontanous Breathing and Patient connected to supplemental oxygen  Post-op Assessment: Post-op Vital signs reviewed, Patient's Cardiovascular Status Stable, Respiratory Function Stable, Patent Airway and No signs of Nausea or vomiting  Post-op Vital Signs: Reviewed and stable  Complications: No notable events documented.

## 2023-01-03 NOTE — Op Note (Signed)
Preoperative Diagnosis:  Visually significant dermatochalasis bilateral  Upper Eyelid(s)  Postoperative Diagnosis:  Same.  Procedure(s) Performed:   Upper eyelid blepharoplasty with excess skin excision  bilateral  Upper Eyelid(s)  Surgeon: Philis Pique. Vickki Muff, M.D.  Assistants: none  Anesthesia: MAC  Specimens: None.  Estimated Blood Loss: Minimal.  Complications: None.  Operative Findings: None Dictated  Procedure:   Allergies were reviewed and the patient is allergic to Patient has no known allergies..   After the risks, benefits, complications and alternatives were discussed with the patient, appropriate informed consent was obtained and the patient was brought to the operating suite. The patient was reclined supine and a timeout was conducted.  The patient was then sedated.  Local anesthetic consisting of a 50-50 mixture of 2% lidocaine with epinephrine and 0.75% bupivacaine with added Hylenex was injected subcutaneously to both  upper eyelid(s). After adequate local was instilled, the patient was prepped and draped in the usual sterile fashion for eyelid surgery.   Attention was turned to the upper eyelids. A 52m upper eyelid crease incision line was marked with calipers on both  upper eyelid(s).  A pinch test was used to estimate the amount of excess skin to remove and this was marked in standard blepharoplasty style fashion. Attention was turned to the  right  upper eyelid. A #15 blade was used to open the premarked incision line. A Skin and muscle flap was excised and hemostasis was obtained with bipolar cautery.   Attention was then turned to the opposite eyelid where the same procedure was performed in the same manner. Hemostasis was obtained with bipolar cautery throughout. All incisions were then closed with a combination of running and interrupted 6-0 fast absorbing plain suture. The patient tolerated the procedure well.  Erythromycin ophthalmic ointment was applied to her  incision sites, followed by ice packs. She was taken to the recovery area where she recovered without difficulty.  Post-Op Plan/Instructions:  The patient was instructed to use ice packs frequently for the next 48 hours. She was instructed to use Erythromycin ophthalmic ointment on her incisions 4 times a day for the next 12 to 14 days. She was given a prescription for tramadol (or similar) for pain control should Tylenol not be effective. [facility].  Tateanna Bach M. FVickki Muff M.D. Ophthalmology

## 2023-01-03 NOTE — Anesthesia Preprocedure Evaluation (Signed)
Anesthesia Evaluation  Patient identified by MRN, date of birth, ID band Patient awake    Reviewed: Allergy & Precautions, H&P , NPO status , Patient's Chart, lab work & pertinent test results, reviewed documented beta blocker date and time   History of Anesthesia Complications Negative for: history of anesthetic complications  Airway Mallampati: II  TM Distance: >3 FB Neck ROM: full    Dental no notable dental hx. (+) Teeth Intact   Pulmonary neg pulmonary ROS   Pulmonary exam normal breath sounds clear to auscultation       Cardiovascular Exercise Tolerance: Good hypertension, On Medications (-) angina (-) Past MI and (-) Cardiac Stents Normal cardiovascular exam(-) dysrhythmias (-) Valvular Problems/Murmurs Rhythm:regular Rate:Normal     Neuro/Psych negative neurological ROS  negative psych ROS   GI/Hepatic negative GI ROS, Neg liver ROS,GERD  Medicated,,  Endo/Other  negative endocrine ROS    Renal/GU negative Renal ROS  negative genitourinary   Musculoskeletal   Abdominal   Peds  Hematology negative hematology ROS (+)   Anesthesia Other Findings Past Medical History: No date: Bladder incontinence No date: Cancer [facility])     Comment:  skin No date: GERD (gastroesophageal reflux disease) No date: H/O degenerative disc disease No date: Hypercholesterolemia No date: Hypertension 09/2010: Palpitations     Comment:  stress echo negative for ischemia, holter without               sigmificant abnormality No date: Vascular ectasia of colon No date: Vitamin D deficiency, unspecified   Reproductive/Obstetrics negative OB ROS                             Anesthesia Physical Anesthesia Plan  ASA: 3  Anesthesia Plan: MAC   Post-op Pain Management:    Induction: Intravenous  PONV Risk Score and Plan:   Airway Management Planned: Nasal Cannula and Natural Airway  Additional  Equipment:   Intra-op Plan:   Post-operative Plan:   Informed Consent: I have reviewed the patients History and Physical, chart, labs and discussed the procedure including the risks, benefits and alternatives for the proposed anesthesia with the patient or authorized representative who has indicated his/her understanding and acceptance.     Dental Advisory Given  Plan Discussed with: CRNA  Anesthesia Plan Comments: (Patient consented for risks of anesthesia including but not limited to:  - adverse reactions to medications - damage to eyes, teeth, lips or other oral mucosa - nerve damage due to positioning  - sore throat or hoarseness - Damage to heart, brain, nerves, lungs, other parts of body or loss of life  Patient voiced understanding and assent.)        Anesthesia Quick Evaluation

## 2023-01-03 NOTE — Interval H&P Note (Signed)
History and Physical Interval Note:  01/03/2023 2:26 PM  Jacqueline Carney  has presented today for surgery, with the diagnosis of H02.831 Dermatochalasis of Right Upper Eyelid H02.834 Dermatochalasis of Left Upper Eyelid.  The various methods of treatment have been discussed with the patient and family. After consideration of risks, benefits and other options for treatment, the patient has consented to  Procedure(s): BLEPHAROPLASTY UPPER EYELID; W/EXCESS SKIN BILATERAL (Bilateral) as a surgical intervention.  The patient's history has been reviewed, patient examined, no change in status, stable for surgery.  I have reviewed the patient's chart and labs.  Questions were answered to the patient's satisfaction.     Ether Griffins, Elivia Robotham M

## 2023-01-03 NOTE — Anesthesia Postprocedure Evaluation (Signed)
Anesthesia Post Note  Patient: Jacqueline Carney  Procedure(s) Performed: BLEPHAROPLASTY UPPER EYELID; W/EXCESS SKIN BILATERAL (Bilateral)  Patient location during evaluation: PACU Anesthesia Type: MAC Level of consciousness: awake and alert Pain management: pain level controlled Vital Signs Assessment: post-procedure vital signs reviewed and stable Respiratory status: spontaneous breathing, nonlabored ventilation, respiratory function stable and patient connected to nasal cannula oxygen Cardiovascular status: stable and blood pressure returned to baseline Postop Assessment: no apparent nausea or vomiting Anesthetic complications: no  No notable events documented.   Last Vitals:  Vitals:   01/03/23 1517 01/03/23 1518  BP:  (!) 129/58  Pulse:  73  Resp: 11 12  Temp:    SpO2:  94%    Last Pain:  Vitals:   01/03/23 1515  TempSrc:   PainSc: 0-No pain                 Stephanie Coup

## 2023-01-03 NOTE — H&P (Signed)
[facility]: [facility]  Primary Care Physician:  Lynnea Ferrier, MD Ophthalmologist: Dr. Hubbard Robinson. Ether Griffins, M.D.  Pre-Procedure History & Physical: HPI:  Jacqueline Carney is a 89 y.o. female here for periocular surgery.   Past Medical History:  Diagnosis Date   Bladder incontinence    Cancer (HCC)    skin   GERD (gastroesophageal reflux disease)    H/O degenerative disc disease    Hypercholesterolemia    Hypertension    Palpitations 09/2010   stress echo negative for ischemia, holter without sigmificant abnormality   Sensorineural hearing loss (SNHL) of both ears    Vascular ectasia of colon    Vitamin D deficiency, unspecified     Past Surgical History:  Procedure Laterality Date   ABDOMINAL HYSTERECTOMY     BACK SURGERY     CATARACT EXTRACTION W/PHACO Left 10/04/2021   Procedure: CATARACT EXTRACTION PHACO AND INTRAOCULAR LENS PLACEMENT (IOC) LEFT 6.09 00:49.9;  Surgeon: Lockie Mola, MD;  Location: [facility];  Service: Ophthalmology;  Laterality: Left;   CATARACT EXTRACTION W/PHACO Right 10/18/2021   Procedure: CATARACT EXTRACTION PHACO AND INTRAOCULAR LENS PLACEMENT (IOC) RIGHT;  Surgeon: Lockie Mola, MD;  Location: [facility];  Service: Ophthalmology;  Laterality: Right;  10.59 1:23.2   CHOLECYSTECTOMY     COLONOSCOPY     with colon polyps   COLONOSCOPY WITH PROPOFOL N/A 07/26/2017   Procedure: COLONOSCOPY WITH PROPOFOL;  Surgeon: Scot Jun, MD;  Location: [facility];  Service: Endoscopy;  Laterality: N/A;    Prior to Admission medications   Medication Sig Start Date End Date Taking? Authorizing Provider  Calcium Carbonate-Vitamin D 600-400 MG-UNIT tablet Take 1 tablet by mouth daily.   Yes [provider]  pantoprazole (PROTONIX) 40 MG tablet Take 40 mg by mouth daily.   Yes [provider]  meloxicam (MOBIC) 15 MG tablet Take 1 tablet (15 mg total) by mouth daily. Patient not taking:  Reported on 01/02/2023 02/07/22   Elinor Parkinson, DPM    Allergies as of 06/14/2022   (No Known Allergies)    Family History  Problem Relation Age of Onset   Breast cancer Neg Hx     Social History   Socioeconomic History   Marital status: Widowed    Spouse name: Not on file   Number of children: Not on file   Years of education: Not on file   Highest education level: Not on file  Occupational History   Not on file  Tobacco Use   Smoking status: Never   Smokeless tobacco: Never  Vaping Use   Vaping status: Never Used  Substance and Sexual Activity   Alcohol use: Never   Drug use: Never   Sexual activity: Not on file  Other Topics Concern   Not on file  Social History Narrative   Not on file   Social Determinants of Health   Financial Resource Strain: Not on file  Food Insecurity: Not on file  Transportation Needs: Not on file  Physical Activity: Not on file  Stress: Not on file  Social Connections: Not on file  Intimate Partner Violence: Not on file    Review of Systems: See HPI, otherwise negative ROS  Physical Exam: BP (!) 160/70   Temp 98.3 F (36.8 C) (Temporal)   Resp 19   Ht 5\' 5"  (1.651 m)   Wt 83 kg   SpO2 98%   BMI 30.45 kg/m  General:   Alert and cooperative  in NAD Head:  Normocephalic and atraumatic. Respiratory:  Normal work of breathing.  Impression/Plan: Jacqueline Carney is here for periocular surgery.  Risks, benefits, limitations, and alternatives regarding surgery have been reviewed with the patient.  Questions have been answered.  All parties agreeable.   Imagene Riches, MD  01/03/2023, 2:26 PM

## 2023-01-04 ENCOUNTER — Encounter: Payer: Self-pay | Admitting: Ophthalmology

## 2023-03-04 ENCOUNTER — Other Ambulatory Visit: Payer: Self-pay | Admitting: Internal Medicine

## 2023-03-04 DIAGNOSIS — Z1231 Encounter for screening mammogram for malignant neoplasm of breast: Secondary | ICD-10-CM

## 2023-04-11 ENCOUNTER — Ambulatory Visit
Admission: RE | Admit: 2023-04-11 | Discharge: 2023-04-11 | Disposition: A | Discharge status: Home / Self Care | Payer: Medicare PPO | Source: Ambulatory Visit | Attending: Internal Medicine | Admitting: Internal Medicine

## 2023-04-11 DIAGNOSIS — Z1231 Encounter for screening mammogram for malignant neoplasm of breast: Secondary | ICD-10-CM | POA: Diagnosis present

## 2023-05-20 ENCOUNTER — Emergency Department
Admission: EM | Admit: 2023-05-20 | Discharge: 2023-05-20 | Discharge status: Against Medical Advice | Attending: Student in an Organized Health Care Education/Training Program | Admitting: Student in an Organized Health Care Education/Training Program

## 2023-05-20 ENCOUNTER — Emergency Department

## 2023-05-20 ENCOUNTER — Other Ambulatory Visit: Payer: Self-pay

## 2023-05-20 ENCOUNTER — Ambulatory Visit: Payer: Self-pay | Admitting: Urology

## 2023-05-20 VITALS — BP 139/61 | HR 90

## 2023-05-20 DIAGNOSIS — R200 Anesthesia of skin: Secondary | ICD-10-CM | POA: Insufficient documentation

## 2023-05-20 DIAGNOSIS — N3941 Urge incontinence: Secondary | ICD-10-CM | POA: Diagnosis not present

## 2023-05-20 DIAGNOSIS — Z8673 Personal history of transient ischemic attack (TIA), and cerebral infarction without residual deficits: Secondary | ICD-10-CM | POA: Insufficient documentation

## 2023-05-20 DIAGNOSIS — R202 Paresthesia of skin: Secondary | ICD-10-CM | POA: Insufficient documentation

## 2023-05-20 DIAGNOSIS — Z5321 Procedure and treatment not carried out due to patient leaving prior to being seen by health care provider: Secondary | ICD-10-CM | POA: Insufficient documentation

## 2023-05-20 DIAGNOSIS — R32 Unspecified urinary incontinence: Secondary | ICD-10-CM

## 2023-05-20 LAB — CBC
HCT: 39.4 % (ref 36.0–46.0)
Hemoglobin: 13.5 g/dL (ref 12.0–15.0)
MCH: 31.9 pg (ref 26.0–34.0)
MCHC: 34.3 g/dL (ref 30.0–36.0)
MCV: 93.1 fL (ref 80.0–100.0)
Platelets: 173 10*3/uL (ref 150–400)
RBC: 4.23 MIL/uL (ref 3.87–5.11)
RDW: 12.6 % (ref 11.5–15.5)
WBC: 5.1 10*3/uL (ref 4.0–10.5)
nRBC: 0 % (ref 0.0–0.2)

## 2023-05-20 LAB — CBG MONITORING, ED: Glucose-Capillary: 117 mg/dL — ABNORMAL HIGH (ref 70–99)

## 2023-05-20 LAB — BASIC METABOLIC PANEL
Anion gap: 9 (ref 5–15)
BUN: 26 mg/dL — ABNORMAL HIGH (ref 8–23)
CO2: 24 mmol/L (ref 22–32)
Calcium: 9.7 mg/dL (ref 8.9–10.3)
Chloride: 110 mmol/L (ref 98–111)
Creatinine, Ser: 0.91 mg/dL (ref 0.44–1.00)
GFR, Estimated: >60 mL/min (ref >60)
Glucose, Bld: 135 mg/dL — ABNORMAL HIGH (ref 70–99)
Potassium: 4 mmol/L (ref 3.5–5.1)
Sodium: 143 mmol/L (ref 135–145)

## 2023-05-20 MED ORDER — GEMTESA 75 MG PO TABS: 75.0000 mg | ORAL_TABLET | Freq: Every day | ORAL | 11 refills | Status: DC

## 2023-05-20 MED ORDER — GEMTESA 75 MG PO TABS: 75.0000 mg | ORAL_TABLET | Freq: Every day | ORAL | Status: DC

## 2023-05-20 NOTE — ED Triage Notes (Addendum)
 Pt here with tingling and numbness that started Sat morning. Pt states it was all on the right side radiating down to her right leg. Pt states she 'feels like she is floating'. Pt talking in complete sentences with equal face symmetry. Pt sent by her primary to r/o stroke.

## 2023-05-20 NOTE — Progress Notes (Signed)
 05/20/2023 9:44 AM   Jacqueline Carney [birth year] [id number]  Referring provider: Lynnea Ferrier, MD [address] [facility] [place],  [place] [address]  Chief Complaint  Patient presents with   Follow-up    HPI: I was consulted to assess the patient's urge incontinence.  She leaks when she goes from a sitting to standing station.  She has high-volume bedwetting.  She soaks 4 pads a day.  No stress incontinence  She voids every 30 to 60 minutes and cannot hold it for 2 hours.  She gets up 4-6 times at night with no ankle edema  She likely had a bladder suspension procedure many years ago  She has had low back and neck surgery.  She describes trying 1 medication years ago but her dentist said stop it due to dry mouth issues in her teeth.  She has had a hysterectomy  No history of kidney stones or UTIs   PMH: Past Medical History:  Diagnosis Date   Bladder incontinence    Cancer (HCC)    skin   GERD (gastroesophageal reflux disease)    H/O degenerative disc disease    Hypercholesterolemia    Hypertension    Palpitations 09/2010   stress echo negative for ischemia, holter without sigmificant abnormality   Sensorineural hearing loss (SNHL) of both ears    Vascular ectasia of colon    Vitamin D deficiency, unspecified     Surgical History: Past Surgical History:  Procedure Laterality Date   ABDOMINAL HYSTERECTOMY     BACK SURGERY     BROW LIFT Bilateral 01/03/2023   Procedure: BLEPHAROPLASTY UPPER EYELID; W/EXCESS SKIN BILATERAL;  Surgeon: Imagene Riches, MD;  Location: [facility];  Service: Ophthalmology;  Laterality: Bilateral;   CATARACT EXTRACTION W/PHACO Left 10/04/2021   Procedure: CATARACT EXTRACTION PHACO AND INTRAOCULAR LENS PLACEMENT (IOC) LEFT 6.09 00:49.9;  Surgeon: Lockie Mola, MD;  Location: [facility];  Service: Ophthalmology;  Laterality: Left;   CATARACT EXTRACTION W/PHACO Right 10/18/2021   Procedure:  CATARACT EXTRACTION PHACO AND INTRAOCULAR LENS PLACEMENT (IOC) RIGHT;  Surgeon: Lockie Mola, MD;  Location: [facility];  Service: Ophthalmology;  Laterality: Right;  10.59 1:23.2   CHOLECYSTECTOMY     COLONOSCOPY     with colon polyps   COLONOSCOPY WITH PROPOFOL N/A 07/26/2017   Procedure: COLONOSCOPY WITH PROPOFOL;  Surgeon: Scot Jun, MD;  Location: [facility] END[facility];  Service: Endoscopy;  Laterality: N/A;    Home Medications:  Allergies as of 05/20/2023   No Known Allergies      Medication List        Accurate as of May 20, 2023  9:44 AM. If you have any questions, ask your nurse or doctor.          STOP taking these medications    erythromycin ophthalmic ointment   meloxicam 15 MG tablet Commonly known as: MOBIC   traMADol 50 MG tablet Commonly known as: Ultram       TAKE these medications    Calcium Carbonate-Vitamin D 600-400 MG-UNIT tablet Take 1 tablet by mouth daily.   pantoprazole 40 MG tablet Commonly known as: PROTONIX Take 40 mg by mouth daily.        Allergies: No Known Allergies  Family History: Family History  Problem Relation Age of Onset   Breast cancer Neg Hx     Social History:  reports that she has never smoked. She has never used smokeless tobacco. She reports that  she does not drink alcohol and does not use drugs.  ROS:                                        Physical Exam: BP 139/61   Pulse 90   Constitutional:  Alert and oriented, No acute distress. HEENT: [place] AT, moist mucus membranes.  Trachea midline, no masses. Cardiovascular: No clubbing, cyanosis, or edema.   Laboratory Data: Lab Results  Component Value Date   WBC 5.0 12/09/2017   HGB 14.1 12/09/2017   HCT 40.6 12/09/2017   MCV 94.0 12/09/2017   PLT 187 12/09/2017    Lab Results  Component Value Date   CREATININE 0.76 12/09/2017    No results found for: "PSA"  No results found for: "TESTOSTERONE"  No  results found for: "HGBA1C"  Urinalysis    Component Value Date/Time   COLORURINE YELLOW (A) 12/09/2017 0939   APPEARANCEUR HAZY (A) 12/09/2017 0939   LABSPEC 1.014 12/09/2017 0939   PHURINE 6.0 12/09/2017 0939   GLUCOSEU NEGATIVE 12/09/2017 0939   HGBUR NEGATIVE 12/09/2017 0939   BILIRUBINUR NEGATIVE 12/09/2017 0939   KETONESUR NEGATIVE 12/09/2017 0939   PROTEINUR NEGATIVE 12/09/2017 0939   NITRITE NEGATIVE 12/09/2017 0939   LEUKOCYTESUR NEGATIVE 12/09/2017 0939    Pertinent Imaging: Urine reviewed and sent for culture.  Chart reviewed  Assessment & Plan: Patient has urge incontinence and bedwetting.  She has high-volume leakage.  She has significant frequency and nocturia.  She will return for pelvic examination and cystoscopy on Gemtesa samples and prescription.  She understands she may needed test in the future without details  1. Urinary incontinence, unspecified type (Primary)  - Urinalysis, Complete   No follow-ups on file.  Martina Sinner, MD  [facility] [address] [place], [place] [address] [phone]

## 2023-05-21 DIAGNOSIS — R2990 Unspecified symptoms and signs involving the nervous system: Secondary | ICD-10-CM | POA: Insufficient documentation

## 2023-05-29 ENCOUNTER — Ambulatory Visit: Admitting: Podiatry

## 2023-05-29 ENCOUNTER — Encounter: Payer: Self-pay | Admitting: Podiatry

## 2023-05-29 VITALS — BP 173/72 | HR 70

## 2023-05-29 DIAGNOSIS — M722 Plantar fascial fibromatosis: Secondary | ICD-10-CM | POA: Diagnosis not present

## 2023-05-29 NOTE — Progress Notes (Signed)
 She presents today with a chief concern of nocturnal numbness and tingling to her plantar heel right as well as burning and tingling to the tip of the hallux right.  She also states that her toes are turning different colors.  Was recently at [facility] for hypotensive episode that resulted in some right sided paresthesias and weakness.  She was not diagnosed with a stroke.  States that her blood pressure was running as high as 300 mmHg.  Objective: Vital signs are stable alert oriented x 3 pulses are strong and palpable she does have microvascular venous insufficiency to the toes toes are cool to the touch and discolored slightly.  She has reproducible pain to Baxters nerve plantar aspect of the right heel she also has no reproducible tenderness to the hallux right.  Assessment: Hypertension blood pressure today 172/72 mmHg.  Baxters neuritis right foot.  And venous insufficiency with microvascular injury.  Plan: Offered her an injection for the Baxters neuritis she declined.  We discussed the possible limited use of Voltaren gel.  And discussed keeping her feet warm.  Should the pain in the heel worsen she will notify us immediately.

## 2023-06-24 ENCOUNTER — Other Ambulatory Visit: Payer: Self-pay

## 2023-06-24 ENCOUNTER — Emergency Department

## 2023-06-24 ENCOUNTER — Inpatient Hospital Stay
Admission: EM | Admit: 2023-06-24 | Discharge: 2023-07-01 | DRG: 393 | Disposition: A | Discharge status: Home / Self Care | Attending: Student in an Organized Health Care Education/Training Program | Admitting: Student in an Organized Health Care Education/Training Program

## 2023-06-24 DIAGNOSIS — Z90710 Acquired absence of both cervix and uterus: Secondary | ICD-10-CM

## 2023-06-24 DIAGNOSIS — Z91410 Personal history of adult physical and sexual abuse: Secondary | ICD-10-CM

## 2023-06-24 DIAGNOSIS — E66811 Obesity, class 1: Secondary | ICD-10-CM | POA: Diagnosis present

## 2023-06-24 DIAGNOSIS — D72829 Elevated white blood cell count, unspecified: Secondary | ICD-10-CM | POA: Diagnosis present

## 2023-06-24 DIAGNOSIS — I10 Essential (primary) hypertension: Secondary | ICD-10-CM

## 2023-06-24 DIAGNOSIS — E559 Vitamin D deficiency, unspecified: Secondary | ICD-10-CM | POA: Diagnosis present

## 2023-06-24 DIAGNOSIS — R1030 Lower abdominal pain, unspecified: Secondary | ICD-10-CM | POA: Diagnosis not present

## 2023-06-24 DIAGNOSIS — I700 Atherosclerosis of aorta: Secondary | ICD-10-CM | POA: Diagnosis present

## 2023-06-24 DIAGNOSIS — F32A Depression, unspecified: Secondary | ICD-10-CM | POA: Diagnosis present

## 2023-06-24 DIAGNOSIS — E7800 Pure hypercholesterolemia, unspecified: Secondary | ICD-10-CM | POA: Diagnosis present

## 2023-06-24 DIAGNOSIS — I7389 Other specified peripheral vascular diseases: Principal | ICD-10-CM | POA: Diagnosis present

## 2023-06-24 DIAGNOSIS — Z800 Family history of malignant neoplasm of digestive organs: Secondary | ICD-10-CM

## 2023-06-24 DIAGNOSIS — I6381 Other cerebral infarction due to occlusion or stenosis of small artery: Secondary | ICD-10-CM | POA: Diagnosis present

## 2023-06-24 DIAGNOSIS — Z961 Presence of intraocular lens: Secondary | ICD-10-CM | POA: Diagnosis present

## 2023-06-24 DIAGNOSIS — N179 Acute kidney failure, unspecified: Secondary | ICD-10-CM | POA: Diagnosis not present

## 2023-06-24 DIAGNOSIS — Z860100 Personal history of colon polyps, unspecified: Secondary | ICD-10-CM

## 2023-06-24 DIAGNOSIS — H903 Sensorineural hearing loss, bilateral: Secondary | ICD-10-CM | POA: Diagnosis present

## 2023-06-24 DIAGNOSIS — K921 Melena: Secondary | ICD-10-CM

## 2023-06-24 DIAGNOSIS — K219 Gastro-esophageal reflux disease without esophagitis: Secondary | ICD-10-CM | POA: Diagnosis present

## 2023-06-24 DIAGNOSIS — E876 Hypokalemia: Secondary | ICD-10-CM | POA: Diagnosis not present

## 2023-06-24 DIAGNOSIS — R112 Nausea with vomiting, unspecified: Secondary | ICD-10-CM

## 2023-06-24 DIAGNOSIS — G8929 Other chronic pain: Secondary | ICD-10-CM | POA: Diagnosis present

## 2023-06-24 DIAGNOSIS — R1084 Generalized abdominal pain: Principal | ICD-10-CM

## 2023-06-24 DIAGNOSIS — Z91411 Personal history of adult psychological abuse: Secondary | ICD-10-CM

## 2023-06-24 DIAGNOSIS — R7989 Other specified abnormal findings of blood chemistry: Secondary | ICD-10-CM

## 2023-06-24 DIAGNOSIS — N1831 Chronic kidney disease, stage 3a: Secondary | ICD-10-CM

## 2023-06-24 DIAGNOSIS — K5730 Diverticulosis of large intestine without perforation or abscess without bleeding: Secondary | ICD-10-CM | POA: Diagnosis present

## 2023-06-24 DIAGNOSIS — Z9049 Acquired absence of other specified parts of digestive tract: Secondary | ICD-10-CM

## 2023-06-24 DIAGNOSIS — G629 Polyneuropathy, unspecified: Secondary | ICD-10-CM | POA: Diagnosis present

## 2023-06-24 DIAGNOSIS — Z6829 Body mass index (BMI) 29.0-29.9, adult: Secondary | ICD-10-CM

## 2023-06-24 DIAGNOSIS — K559 Vascular disorder of intestine, unspecified: Principal | ICD-10-CM | POA: Diagnosis present

## 2023-06-24 DIAGNOSIS — R7303 Prediabetes: Secondary | ICD-10-CM | POA: Diagnosis present

## 2023-06-24 DIAGNOSIS — R29704 NIHSS score 4: Secondary | ICD-10-CM | POA: Diagnosis present

## 2023-06-24 DIAGNOSIS — Z6841 Body Mass Index (BMI) 40.0 and over, adult: Secondary | ICD-10-CM

## 2023-06-24 DIAGNOSIS — I129 Hypertensive chronic kidney disease with stage 1 through stage 4 chronic kidney disease, or unspecified chronic kidney disease: Secondary | ICD-10-CM | POA: Diagnosis present

## 2023-06-24 DIAGNOSIS — R109 Unspecified abdominal pain: Secondary | ICD-10-CM | POA: Diagnosis present

## 2023-06-24 DIAGNOSIS — Z85828 Personal history of other malignant neoplasm of skin: Secondary | ICD-10-CM

## 2023-06-24 DIAGNOSIS — Z79899 Other long term (current) drug therapy: Secondary | ICD-10-CM

## 2023-06-24 LAB — PROTIME-INR
INR: 1 (ref 0.8–1.2)
Prothrombin Time: 13.8 s (ref 11.4–15.2)

## 2023-06-24 LAB — TSH: TSH: 3.926 u[IU]/mL (ref 0.350–4.500)

## 2023-06-24 LAB — CBC
HCT: 46.1 % — ABNORMAL HIGH (ref 36.0–46.0)
Hemoglobin: 16 g/dL — ABNORMAL HIGH (ref 12.0–15.0)
MCH: 31.4 pg (ref 26.0–34.0)
MCHC: 34.7 g/dL (ref 30.0–36.0)
MCV: 90.4 fL (ref 80.0–100.0)
Platelets: 231 10*3/uL (ref 150–400)
RBC: 5.1 MIL/uL (ref 3.87–5.11)
RDW: 12.3 % (ref 11.5–15.5)
WBC: 13.9 10*3/uL — ABNORMAL HIGH (ref 4.0–10.5)
nRBC: 0 % (ref 0.0–0.2)

## 2023-06-24 LAB — COMPREHENSIVE METABOLIC PANEL WITH GFR
ALT: 42 U/L (ref 0–44)
AST: 74 U/L — ABNORMAL HIGH (ref 15–41)
Albumin: 4.3 g/dL (ref 3.5–5.0)
Alkaline Phosphatase: 108 U/L (ref 38–126)
Anion gap: 14 (ref 5–15)
BUN: 29 mg/dL — ABNORMAL HIGH (ref 8–23)
CO2: 23 mmol/L (ref 22–32)
Calcium: 10.2 mg/dL (ref 8.9–10.3)
Chloride: 102 mmol/L (ref 98–111)
Creatinine, Ser: 1.62 mg/dL — ABNORMAL HIGH (ref 0.44–1.00)
GFR, Estimated: 31 mL/min — ABNORMAL LOW (ref >60)
Glucose, Bld: 208 mg/dL — ABNORMAL HIGH (ref 70–99)
Potassium: 4.9 mmol/L (ref 3.5–5.1)
Sodium: 139 mmol/L (ref 135–145)
Total Bilirubin: 2.1 mg/dL — ABNORMAL HIGH (ref 0.0–1.2)
Total Protein: 7.2 g/dL (ref 6.5–8.1)

## 2023-06-24 LAB — LIPASE, BLOOD: Lipase: 35 U/L (ref 11–51)

## 2023-06-24 LAB — APTT: aPTT: 31 s (ref 24–36)

## 2023-06-24 MED ORDER — GABAPENTIN 100 MG PO CAPS
100.0000 mg | ORAL_CAPSULE | Freq: Every day | ORAL | Status: DC
  Administered 2023-06-25 – 2023-06-30 (×6): 100 mg via ORAL
  Filled 2023-06-24 (×7): qty 1

## 2023-06-24 MED ORDER — ESCITALOPRAM OXALATE 10 MG PO TABS
5.0000 mg | ORAL_TABLET | Freq: Every day | ORAL | Status: DC
  Administered 2023-06-25 – 2023-07-01 (×7): 5 mg via ORAL
  Filled 2023-06-24 (×8): qty 1

## 2023-06-24 MED ORDER — IOHEXOL 300 MG/ML  SOLN
80.0000 mL | Freq: Once | INTRAMUSCULAR | Status: AC | PRN
Start: 2023-06-24 — End: 2023-06-24
  Administered 2023-06-24: 80 mL via INTRAVENOUS

## 2023-06-24 MED ORDER — AMLODIPINE BESYLATE 5 MG PO TABS
5.0000 mg | ORAL_TABLET | Freq: Every day | ORAL | Status: DC
  Administered 2023-06-25 – 2023-06-26 (×2): 5 mg via ORAL
  Filled 2023-06-24 (×2): qty 1

## 2023-06-24 MED ORDER — ACETAMINOPHEN 325 MG PO TABS: 325.0000 mg | ORAL_TABLET | Freq: Four times a day (QID) | ORAL | Status: DC | PRN

## 2023-06-24 MED ORDER — MIRABEGRON ER 25 MG PO TB24
25.0000 mg | ORAL_TABLET | Freq: Every day | ORAL | Status: DC
  Administered 2023-06-25 – 2023-07-01 (×7): 25 mg via ORAL
  Filled 2023-06-24 (×8): qty 1

## 2023-06-24 MED ORDER — ALUM & MAG HYDROXIDE-SIMETH 200-200-20 MG/5ML PO SUSP
15.0000 mL | Freq: Once | ORAL | Status: AC
  Administered 2023-06-24: 15 mL via ORAL
  Filled 2023-06-24: qty 30

## 2023-06-24 MED ORDER — SODIUM CHLORIDE 0.9 % IV SOLN: INTRAVENOUS | Status: DC

## 2023-06-24 MED ORDER — LACTATED RINGERS IV BOLUS
1000.0000 mL | Freq: Once | INTRAVENOUS | Status: AC
  Administered 2023-06-24: 1000 mL via INTRAVENOUS

## 2023-06-24 MED ORDER — MORPHINE SULFATE (PF) 4 MG/ML IV SOLN
4.0000 mg | Freq: Once | INTRAVENOUS | Status: AC
  Administered 2023-06-24: 4 mg via INTRAVENOUS
  Filled 2023-06-24: qty 1

## 2023-06-24 MED ORDER — SENNOSIDES-DOCUSATE SODIUM 8.6-50 MG PO TABS
1.0000 | ORAL_TABLET | Freq: Two times a day (BID) | ORAL | Status: DC
Start: 2023-06-24 — End: 2023-06-24

## 2023-06-24 MED ORDER — MORPHINE SULFATE (PF) 2 MG/ML IV SOLN
0.5000 mg | INTRAVENOUS | Status: DC | PRN
  Administered 2023-06-25 (×2): 0.5 mg via INTRAVENOUS
  Filled 2023-06-24 (×2): qty 1

## 2023-06-24 MED ORDER — PANTOPRAZOLE SODIUM 40 MG PO TBEC
40.0000 mg | DELAYED_RELEASE_TABLET | Freq: Every day | ORAL | Status: DC
  Administered 2023-06-25 – 2023-07-01 (×7): 40 mg via ORAL
  Filled 2023-06-24 (×8): qty 1

## 2023-06-24 MED ORDER — ONDANSETRON HCL 4 MG/2ML IJ SOLN
4.0000 mg | Freq: Once | INTRAMUSCULAR | Status: AC
  Administered 2023-06-24: 4 mg via INTRAVENOUS
  Filled 2023-06-24: qty 2

## 2023-06-24 MED ORDER — AMLODIPINE BESYLATE 5 MG PO TABS
10.0000 mg | ORAL_TABLET | Freq: Every day | ORAL | Status: DC
Start: 2023-06-24 — End: 2023-06-24

## 2023-06-24 MED ORDER — HYDROMORPHONE HCL 1 MG/ML IJ SOLN
0.5000 mg | Freq: Once | INTRAMUSCULAR | Status: AC
  Administered 2023-06-24: 0.5 mg via INTRAVENOUS
  Filled 2023-06-24: qty 0.5

## 2023-06-24 MED ORDER — OXYCODONE HCL 5 MG PO TABS: 5.0000 mg | ORAL_TABLET | Freq: Four times a day (QID) | ORAL | Status: DC | PRN

## 2023-06-24 MED ORDER — ONDANSETRON HCL 4 MG/2ML IJ SOLN
4.0000 mg | Freq: Three times a day (TID) | INTRAMUSCULAR | Status: DC | PRN
  Administered 2023-06-24 – 2023-06-25 (×2): 4 mg via INTRAVENOUS
  Filled 2023-06-24 (×2): qty 2

## 2023-06-24 MED ORDER — HYDRALAZINE HCL 20 MG/ML IJ SOLN: 5.0000 mg | INTRAMUSCULAR | Status: DC | PRN

## 2023-06-24 MED ORDER — OYSTER SHELL CALCIUM/D3 500-5 MG-MCG PO TABS
1.0000 | ORAL_TABLET | Freq: Every day | ORAL | Status: DC
  Administered 2023-06-25 – 2023-07-01 (×7): 1 via ORAL
  Filled 2023-06-24 (×8): qty 1

## 2023-06-24 NOTE — ED Notes (Signed)
 CCMD called to place pt on monitor

## 2023-06-24 NOTE — ED Triage Notes (Signed)
 Pt comes with c/o belly pain that started today. Pt states nausea and vomiting. Pt given zofran  by EMS.

## 2023-06-24 NOTE — ED Notes (Signed)
 Pt transported to CT ?

## 2023-06-24 NOTE — ED Triage Notes (Signed)
 Patient brought in by EMS from home for abdominal pain with emesis that started today.   EMS administered: 4mg  zofran   EMS vitals: 164/82 b/p 80HR 95% RA

## 2023-06-24 NOTE — ED Notes (Signed)
 Pt is having another episode of vomiting up bile. Unable to give her the scheduled PO medications in the [facility] that is currently due at 2200. Dr. Rosalea Collin informed via epic secure chat.

## 2023-06-24 NOTE — ED Provider Notes (Signed)
-----------------------------------------   7:42 PM on 06/24/2023 ----------------------------------------- I have assumed patient care.  CT scan has finally been read as large amount of stool and fluid throughout the colon abruptly ending in the distal sigmoid colon where there was a caliber change.  Concern for possible malignant stricture.  I spoke to the patient and son, patient states when she has had colonoscopies in the past they have had used "a pediatric scope" due to significant narrowing and twisting of the colon.  Patient continues to have pain despite pain medication.  Will admit to the hospital service for further workup and treatment and likely GI consultation.   Ruth Cove, MD 06/24/23 1943

## 2023-06-24 NOTE — ED Notes (Signed)
 C?O feeling faint, numbness throughout arms and legs.  Reassurance given. Placed in recliner chair, patient tolerated transfer well.  Family with patient.

## 2023-06-24 NOTE — ED Provider Notes (Signed)
 [facility] Provider Note    Event Date/Time   First MD Initiated Contact with Patient 06/24/23 1352     (approximate)   History   Abdominal Pain   HPI Jacqueline Carney is a 89 y.o. female with CKD stage III, HTN, HLD presenting today for abdominal pain.  Patient states she woke up this morning with sharp generalized abdominal pain.  Not 1 particular spot.  Associated with nausea and vomiting this morning and now just nausea.  States she has not had a bowel movement in 3 days.  Otherwise denies fever, cough, congestion, dysuria, chest pain, shortness of breath.  No hematochezia or melena.     Physical Exam   Triage Vital Signs: ED Triage Vitals  Encounter Vitals Group     BP 06/24/23 1243 (!) 122/59     Systolic BP Percentile --      Diastolic BP Percentile --      Pulse Rate 06/24/23 1243 98     Resp 06/24/23 1243 18     Temp 06/24/23 1243 98 F (36.7 C)     Temp src --      SpO2 06/24/23 1243 97 %     Weight --      Height --      Head Circumference --      Peak Flow --      Pain Score 06/24/23 1242 10     Pain Loc --      Pain Education --      Exclude from Growth Chart --     Most recent vital signs: Vitals:   06/24/23 1243 06/24/23 1414  BP: (!) 122/59 94/62  Pulse: 98 95  Resp: 18 (!) 22  Temp: 98 F (36.7 C) 98.1 F (36.7 C)  SpO2: 97%    Physical Exam: I have reviewed the vital signs and nursing notes. General: Awake, alert, no acute distress.  Nontoxic appearing. Head:  Atraumatic, normocephalic.   ENT:  EOM intact, PERRL. Oral mucosa is pink and moist with no lesions. Neck: Neck is supple with full range of motion, No meningeal signs. Cardiovascular:  RRR, No murmurs. Peripheral pulses palpable and equal bilaterally. Respiratory:  Symmetrical chest wall expansion.  No rhonchi, rales, or wheezes.  Good air movement throughout.  No use of accessory muscles.   Musculoskeletal:  No cyanosis or edema. Moving extremities  with full ROM Abdomen:  Soft, generalized tenderness throughout the abdomen, nondistended. Neuro:  GCS 15, moving all four extremities, interacting appropriately. Speech clear. Psych:  Calm, appropriate.   Skin:  Warm, dry, no rash.    ED Results / Procedures / Treatments   Labs (all labs ordered are listed, but only abnormal results are displayed) Labs Reviewed  CBC - Abnormal; Notable for the following components:      Result Value   WBC 13.9 (*)    Hemoglobin 16.0 (*)    HCT 46.1 (*)    All other components within normal limits  COMPREHENSIVE METABOLIC PANEL WITH GFR - Abnormal; Notable for the following components:   Glucose, Bld 208 (*)    BUN 29 (*)    Creatinine, Ser 1.62 (*)    AST 74 (*)    Total Bilirubin 2.1 (*)    GFR, Estimated 31 (*)    All other components within normal limits  LIPASE, BLOOD  URINALYSIS, ROUTINE W REFLEX MICROSCOPIC     EKG    RADIOLOGY CT abdomen/pelvis pending at time of signout  PROCEDURES:  Critical Care performed: No  Procedures   MEDICATIONS ORDERED IN ED: Medications  ondansetron  (ZOFRAN ) injection 4 mg (4 mg Intravenous Given 06/24/23 1415)  morphine  (PF) 4 MG/ML injection 4 mg (4 mg Intravenous Given 06/24/23 1414)  lactated ringers  bolus 1,000 mL (1,000 mLs Intravenous New Bag/Given 06/24/23 1442)     IMPRESSION / MDM / ASSESSMENT AND PLAN / ED COURSE  I reviewed the triage vital signs and the nursing notes.                              Differential diagnosis includes, but is not limited to, SBO, constipation, diverticulitis, appendicitis, pancreatitis, colitis, enteritis  Patient's presentation is most consistent with acute complicated illness / injury requiring diagnostic workup.  Patient is an 89 year old female presenting today for generalized tenderness to palpation throughout the abdomen associated with nausea and vomiting.  No bowel movements in 3 days with some concern for possible bowel obstruction.   Laboratory workup with slight leukocytosis.  CMP with slightly elevated T. bili but otherwise liver function labs normal.  Creatinine consistent with her baseline.  Lipase normal.  Will get CT abdomen/pelvis for further evaluation of acute intra-abdominal pathology.  Patient given 1 L fluids, morphine , and Zofran  for symptomatic management.  Signed out to oncoming provider pending results of CT.  The patient is on the cardiac monitor to evaluate for evidence of arrhythmia and/or significant heart rate changes.     FINAL CLINICAL IMPRESSION(S) / ED DIAGNOSES   Final diagnoses:  Generalized abdominal pain  Nausea and vomiting, unspecified vomiting type     Rx / DC Orders   ED Discharge Orders     None        Note:  This document was prepared using Dragon voice recognition software and may include unintentional dictation errors.   Kandee Orion, MD 06/24/23 1450

## 2023-06-24 NOTE — H&P (Signed)
 History and Physical    Jacqueline Carney:[id number] DOB: 04-09-1933 DOA: 06/24/2023  Referring MD/NP/PA:   PCP: Melchor Spoon, MD   Patient coming from:  The patient is coming from home.     Chief Complaint: Nausea, vomiting, abdominal pain  HPI: Jacqueline Carney is a 89 y.o. female with medical history significant of HTN, HLD, prediabetes, GERD, depression, CKD-3A, strokelike symptoms, degenerative disc disease, vascular ectasia of colon, urinary incontinence, skin cancer, colon polyp, who presents with nausea, vomiting, abdominal pain.  Patient states that her symptoms started this morning, including nausea, 3 times of nonbilious nonbloody vomiting, abdominal pain, no diarrhea.  Last bowel movement was 3 days ago.  Her abdomen pain is located in the lower abdomen, constant, aching, moderate, nonradiating, not aggravated or alleviated by any known factors.  No fever or chills.  No chest pain, cough, SOB.  No symptoms of UTI.  Patient also reports tingling and numbness in both hands and both feet.  Of note, pt had screening colonoscopy on 07/26/2017, according to Dr. Porfirio Bristol Elliot's note, her colon was very tortuous with difficult passage and a pediatric colonoscope worked very well.  That colonoscopy showed one diminutive polyp in the cecum, removed with a jumbo cold forceps. The examination was otherwise normal.   Data reviewed independently and ED Course: pt was found to have WBC 13.9, worsening renal function with creatinine 1.62, BUN 29 and GFR 31 (baseline creatinine 0.91 on 05/20/2023), mild abnormal liver function (ALP 108, AST 74, ALT 42, total bilirubin 2.1).  Temperature normal, blood pressure 94/62, 153/66, 118/77, heart rate 98, RR 22, oxygen saturation 97% on room air.  Patient is placed on MedSurg bed for observation.  Dr. Baldomero Bone of GI is consulted.   CT abdomen/pelvis: 1. Large amount of stool mixed with fluid within the ascending, transverse, descending and  proximal sigmoid colon. Abrupt appearing caliber change at the distal sigmoid colon with no stool or fluid distal to this, benign or malignant stricture cannot be excluded at this location. Correlation with colonoscopy is recommended. There is some wall thickening and pericolonic edema involving the splenic flexure and descending colon proximal to the caliber change suggesting colon inflammation. There is no intramural air or free air. No dilated small bowel is seen. 2. Small free fluid in the abdomen and pelvis. 3. Aortic atherosclerosis.   Aortic Atherosclerosis (ICD10-I70.0).     EKG: Not done in ED, will get one.     Review of Systems:   General: no fevers, chills, no body weight gain, has poor appetite, has fatigue HEENT: no blurry vision, hearing changes or sore throat Respiratory: no dyspnea, coughing, wheezing CV: no chest pain, no palpitations GI: has nausea, vomiting, abdominal pain, no diarrhea GU: no dysuria, burning on urination, increased urinary frequency, hematuria  Ext: no leg edema Neuro: no unilateral weakness, numbness, or tingling, no vision change or hearing loss.  Has numbness and tingling in both hands and feet Skin: no rash, no skin tear. MSK: No muscle spasm, no deformity, no limitation of range of movement in spin Heme: No easy bruising.  Travel history: No recent long distant travel.   Allergy: No Known Allergies  Past Medical History:  Diagnosis Date   Bladder incontinence    Cancer (HCC)    skin   GERD (gastroesophageal reflux disease)    H/O degenerative disc disease    Hypercholesterolemia    Hypertension    Palpitations 09/2010   stress echo negative for ischemia, holter  without sigmificant abnormality   Sensorineural hearing loss (SNHL) of both ears    Vascular ectasia of colon    Vitamin D deficiency, unspecified     Past Surgical History:  Procedure Laterality Date   ABDOMINAL HYSTERECTOMY     BACK SURGERY     BROW LIFT  Bilateral 01/03/2023   Procedure: BLEPHAROPLASTY UPPER EYELID; W/EXCESS SKIN BILATERAL;  Surgeon: Zacarias Hermann, MD;  Location: [facility];  Service: Ophthalmology;  Laterality: Bilateral;   CATARACT EXTRACTION W/PHACO Left 10/04/2021   Procedure: CATARACT EXTRACTION PHACO AND INTRAOCULAR LENS PLACEMENT (IOC) LEFT 6.09 00:49.9;  Surgeon: Annell Kidney, MD;  Location: [facility];  Service: Ophthalmology;  Laterality: Left;   CATARACT EXTRACTION W/PHACO Right 10/18/2021   Procedure: CATARACT EXTRACTION PHACO AND INTRAOCULAR LENS PLACEMENT (IOC) RIGHT;  Surgeon: Annell Kidney, MD;  Location: [facility];  Service: Ophthalmology;  Laterality: Right;  10.59 1:23.2   CHOLECYSTECTOMY     COLONOSCOPY     with colon polyps   COLONOSCOPY WITH PROPOFOL  N/A 07/26/2017   Procedure: COLONOSCOPY WITH PROPOFOL ;  Surgeon: Cassie Click, MD;  Location: [facility];  Service: Endoscopy;  Laterality: N/A;    Social History:  reports that she has never smoked. She has never used smokeless tobacco. She reports that she does not drink alcohol and does not use drugs.  Family History:  Family History  Problem Relation Age of Onset   Colon cancer Mother    Breast cancer Neg Hx      Prior to Admission medications   Medication Sig Start Date End Date Taking? Authorizing Provider  amLODipine  (NORVASC ) 10 MG tablet Take 1 tablet by mouth daily. 05/28/23 05/27/24  [provider]  Calcium  Carbonate-Vitamin D 600-400 MG-UNIT tablet Take 1 tablet by mouth daily.    [provider]  escitalopram  (LEXAPRO ) 5 MG tablet Take by mouth. 05/28/23   [provider]  pantoprazole  (PROTONIX ) 40 MG tablet Take 40 mg by mouth daily.    [provider]  Vibegron  (GEMTESA ) 75 MG TABS Take 1 tablet (75 mg total) by mouth daily. 05/20/23   Erman Hayward, MD    Physical Exam: Vitals:   06/24/23 1600 06/24/23 1700 06/24/23 1800 06/24/23 1841  BP: (!)  153/66 (!) 135/59 118/77   Pulse: 96 94 96   Resp: (!) 22 19 20    Temp:    97.8 F (36.6 C)  TempSrc:    Oral  SpO2: 97% 95% 97%    General: Not in acute distress HEENT:       Eyes: PERRL, EOMI, no jaundice       ENT: No discharge from the ears and nose, no pharynx injection, no tonsillar enlargement.        Neck: No JVD, no bruit, no mass felt. Heme: No neck lymph node enlargement. Cardiac: S1/S2, RRR, No murmurs, No gallops or rubs. Respiratory: No rales, wheezing, rhonchi or rubs. GI: has mild distention with diffuse tenderness, no rebound pain, no organomegaly, BS present. GU: No hematuria Ext: No pitting leg edema bilaterally. 1+DP/PT pulse bilaterally. Musculoskeletal: No joint deformities, No joint redness or warmth, no limitation of ROM in spin. Skin: No rashes.  Neuro: Alert, oriented X3, cranial nerves II-XII grossly intact, moves all extremities normally. Psych: Patient is not psychotic, no suicidal or hemocidal ideation.  Labs on Admission: I have personally reviewed following labs and imaging studies  CBC: Recent Labs  Lab 06/24/23 1244  WBC 13.9*  HGB 16.0*  HCT 46.1*  MCV 90.4  PLT 231   Basic Metabolic Panel: Recent Labs  Lab 06/24/23 1342  NA 139  K 4.9  CL 102  CO2 23  GLUCOSE 208*  BUN 29*  CREATININE 1.62*  CALCIUM  10.2   GFR: CrCl cannot be calculated (Unknown ideal weight.). Liver Function Tests: Recent Labs  Lab 06/24/23 1342  AST 74*  ALT 42  ALKPHOS 108  BILITOT 2.1*  PROT 7.2  ALBUMIN 4.3   Recent Labs  Lab 06/24/23 1342  LIPASE 35   No results for input(s): "AMMONIA" in the last 168 hours. Coagulation Profile: No results for input(s): "INR", "PROTIME" in the last 168 hours. Cardiac Enzymes: No results for input(s): "CKTOTAL", "CKMB", "CKMBINDEX", "TROPONINI" in the last 168 hours. BNP (last 3 results) No results for input(s): "PROBNP" in the last 8760 hours. HbA1C: No results for input(s): "HGBA1C" in the last 72  hours. CBG: No results for input(s): "GLUCAP" in the last 168 hours. Lipid Profile: No results for input(s): "CHOL", "HDL", "LDLCALC", "TRIG", "CHOLHDL", "LDLDIRECT" in the last 72 hours. Thyroid Function Tests: No results for input(s): "TSH", "T4TOTAL", "FREET4", "T3FREE", "THYROIDAB" in the last 72 hours. Anemia Panel: No results for input(s): "VITAMINB12", "FOLATE", "FERRITIN", "TIBC", "IRON", "RETICCTPCT" in the last 72 hours. Urine analysis:    Component Value Date/Time   COLORURINE YELLOW (A) 12/09/2017 0939   APPEARANCEUR HAZY (A) 12/09/2017 0939   LABSPEC 1.014 12/09/2017 0939   PHURINE 6.0 12/09/2017 0939   GLUCOSEU NEGATIVE 12/09/2017 0939   HGBUR NEGATIVE 12/09/2017 0939   BILIRUBINUR NEGATIVE 12/09/2017 0939   KETONESUR NEGATIVE 12/09/2017 0939   PROTEINUR NEGATIVE 12/09/2017 0939   NITRITE NEGATIVE 12/09/2017 0939   LEUKOCYTESUR NEGATIVE 12/09/2017 0939   Sepsis Labs: @LABRCNTIP (procalcitonin:4,lacticidven:4) )No results found for this or any previous visit (from the past 240 hours).   Radiological Exams on Admission:   Assessment/Plan Principal Problem:   Abdominal pain Active Problems:   Leukocytosis   Hypertension   Prediabetes   Acute renal failure superimposed on stage 3a chronic kidney disease (HCC)   Abnormal LFTs   Peripheral neuropathy   Depression   Assessment and Plan:   Abdominal pain: CT scan showed large amount of stool and fluid throughout the colon abruptly ending in the distal sigmoid colon where there was a caliber change, concerning for possible stricture or malignancy. Consulted Dr.Vanga of GI  -will place in med-surg bed for obs -check AFP - As needed Zofran  for nausea vomiting - As needed morphine , oxycodone , Tylenol  for pain - IV fluid: 1 L LR, then 75 cc/h  Leukocytosis: WBC 13.9.  No fever.  No source of infection identified.  Likely reactive -Follow-up with CBC  Hypertension: Blood pressures soft -IV hydralazine  as  needed - Decrease amlodipine  dose from 10 to 5 mg daily  Prediabetes: Recent A1c 5.4, well-controlled.  Patient is not taking medications. -Check blood sugar every morning  Acute renal failure superimposed on stage 3a chronic kidney disease (HCC): Likely due to dehydration, no hydronephrosis on CT scan. -Avoid using renal toxic medications - IV fluid as above  Mild abnormal LFTs -Check hepatitis panel - Judicious use Tylenol , 325 mg as needed  Possible peripheral neuropathy: Patient reports tingling and numbness in both hands and feet. - Start Neurontin  empirically 100 mg every night - Check vitamin B12 level and TSH today  Depression - Lexapro       DVT ppx: SCD  Code Status: Full code   Family Communication:     Yes, patient's son and  the daughter-in-law at bed side.     Disposition Plan:  Anticipate discharge back to previous environment  Consults called:  Dr. Baldomero Bone of GI is consulted.  Admission status and Level of care: Med-Surg:    for obs as inpt        Dispo: The patient is from: Home              Anticipated d/c is to: Home              Anticipated d/c date is: 1 day              Patient currently is not medically stable to d/c.    Severity of Illness:  The appropriate patient status for this patient is OBSERVATION. Observation status is judged to be reasonable and necessary in order to provide the required intensity of service to ensure the patient's safety. The patient's presenting symptoms, physical exam findings, and initial radiographic and laboratory data in the context of their medical condition is felt to place them at decreased risk for further clinical deterioration. Furthermore, it is anticipated that the patient will be medically stable for discharge from the hospital within 2 midnights of admission.        Date of Service 06/24/2023    Fidencio Hue [facility]   If 7PM-7AM, please contact night-coverage [email or web address] 06/24/2023,  9:05 PM

## 2023-06-25 ENCOUNTER — Observation Stay

## 2023-06-25 DIAGNOSIS — I639 Cerebral infarction, unspecified: Secondary | ICD-10-CM | POA: Diagnosis not present

## 2023-06-25 DIAGNOSIS — Z85828 Personal history of other malignant neoplasm of skin: Secondary | ICD-10-CM | POA: Diagnosis not present

## 2023-06-25 DIAGNOSIS — K5730 Diverticulosis of large intestine without perforation or abscess without bleeding: Secondary | ICD-10-CM | POA: Diagnosis present

## 2023-06-25 DIAGNOSIS — K559 Vascular disorder of intestine, unspecified: Secondary | ICD-10-CM | POA: Diagnosis not present

## 2023-06-25 DIAGNOSIS — R1084 Generalized abdominal pain: Secondary | ICD-10-CM | POA: Diagnosis present

## 2023-06-25 DIAGNOSIS — Z6841 Body Mass Index (BMI) 40.0 and over, adult: Secondary | ICD-10-CM | POA: Diagnosis not present

## 2023-06-25 DIAGNOSIS — K219 Gastro-esophageal reflux disease without esophagitis: Secondary | ICD-10-CM | POA: Diagnosis present

## 2023-06-25 DIAGNOSIS — R109 Unspecified abdominal pain: Secondary | ICD-10-CM | POA: Diagnosis not present

## 2023-06-25 DIAGNOSIS — R933 Abnormal findings on diagnostic imaging of other parts of digestive tract: Secondary | ICD-10-CM

## 2023-06-25 DIAGNOSIS — I6381 Other cerebral infarction due to occlusion or stenosis of small artery: Secondary | ICD-10-CM | POA: Diagnosis present

## 2023-06-25 DIAGNOSIS — R1030 Lower abdominal pain, unspecified: Secondary | ICD-10-CM | POA: Diagnosis not present

## 2023-06-25 DIAGNOSIS — E66811 Obesity, class 1: Secondary | ICD-10-CM | POA: Diagnosis present

## 2023-06-25 DIAGNOSIS — K625 Hemorrhage of anus and rectum: Secondary | ICD-10-CM | POA: Diagnosis not present

## 2023-06-25 DIAGNOSIS — N1831 Chronic kidney disease, stage 3a: Secondary | ICD-10-CM | POA: Diagnosis present

## 2023-06-25 DIAGNOSIS — I700 Atherosclerosis of aorta: Secondary | ICD-10-CM | POA: Diagnosis present

## 2023-06-25 DIAGNOSIS — F32A Depression, unspecified: Secondary | ICD-10-CM | POA: Diagnosis present

## 2023-06-25 DIAGNOSIS — Z9049 Acquired absence of other specified parts of digestive tract: Secondary | ICD-10-CM | POA: Diagnosis not present

## 2023-06-25 DIAGNOSIS — Z79899 Other long term (current) drug therapy: Secondary | ICD-10-CM | POA: Diagnosis not present

## 2023-06-25 DIAGNOSIS — G629 Polyneuropathy, unspecified: Secondary | ICD-10-CM | POA: Diagnosis present

## 2023-06-25 DIAGNOSIS — E559 Vitamin D deficiency, unspecified: Secondary | ICD-10-CM | POA: Diagnosis present

## 2023-06-25 DIAGNOSIS — Z961 Presence of intraocular lens: Secondary | ICD-10-CM | POA: Diagnosis present

## 2023-06-25 DIAGNOSIS — K56609 Unspecified intestinal obstruction, unspecified as to partial versus complete obstruction: Secondary | ICD-10-CM | POA: Diagnosis not present

## 2023-06-25 DIAGNOSIS — I7389 Other specified peripheral vascular diseases: Secondary | ICD-10-CM | POA: Diagnosis present

## 2023-06-25 DIAGNOSIS — E7800 Pure hypercholesterolemia, unspecified: Secondary | ICD-10-CM | POA: Diagnosis present

## 2023-06-25 DIAGNOSIS — N179 Acute kidney failure, unspecified: Secondary | ICD-10-CM | POA: Diagnosis present

## 2023-06-25 DIAGNOSIS — R29704 NIHSS score 4: Secondary | ICD-10-CM | POA: Diagnosis present

## 2023-06-25 DIAGNOSIS — Z90710 Acquired absence of both cervix and uterus: Secondary | ICD-10-CM | POA: Diagnosis not present

## 2023-06-25 DIAGNOSIS — Z860100 Personal history of colon polyps, unspecified: Secondary | ICD-10-CM | POA: Diagnosis not present

## 2023-06-25 DIAGNOSIS — Z9889 Other specified postprocedural states: Secondary | ICD-10-CM | POA: Diagnosis not present

## 2023-06-25 DIAGNOSIS — R7303 Prediabetes: Secondary | ICD-10-CM | POA: Diagnosis present

## 2023-06-25 DIAGNOSIS — R112 Nausea with vomiting, unspecified: Secondary | ICD-10-CM | POA: Diagnosis not present

## 2023-06-25 DIAGNOSIS — E876 Hypokalemia: Secondary | ICD-10-CM | POA: Diagnosis not present

## 2023-06-25 DIAGNOSIS — H903 Sensorineural hearing loss, bilateral: Secondary | ICD-10-CM | POA: Diagnosis present

## 2023-06-25 DIAGNOSIS — G8929 Other chronic pain: Secondary | ICD-10-CM | POA: Diagnosis present

## 2023-06-25 DIAGNOSIS — I129 Hypertensive chronic kidney disease with stage 1 through stage 4 chronic kidney disease, or unspecified chronic kidney disease: Secondary | ICD-10-CM | POA: Diagnosis present

## 2023-06-25 DIAGNOSIS — K921 Melena: Secondary | ICD-10-CM

## 2023-06-25 LAB — CBC
HCT: 45.6 % (ref 36.0–46.0)
Hemoglobin: 16 g/dL — ABNORMAL HIGH (ref 12.0–15.0)
MCH: 32.6 pg (ref 26.0–34.0)
MCHC: 35.1 g/dL (ref 30.0–36.0)
MCV: 92.9 fL (ref 80.0–100.0)
Platelets: 207 10*3/uL (ref 150–400)
RBC: 4.91 MIL/uL (ref 3.87–5.11)
RDW: 12.8 % (ref 11.5–15.5)
WBC: 12.3 10*3/uL — ABNORMAL HIGH (ref 4.0–10.5)
nRBC: 0 % (ref 0.0–0.2)

## 2023-06-25 LAB — BASIC METABOLIC PANEL WITH GFR
Anion gap: 12 (ref 5–15)
BUN: 46 mg/dL — ABNORMAL HIGH (ref 8–23)
CO2: 22 mmol/L (ref 22–32)
Calcium: 8.5 mg/dL — ABNORMAL LOW (ref 8.9–10.3)
Chloride: 105 mmol/L (ref 98–111)
Creatinine, Ser: 1.96 mg/dL — ABNORMAL HIGH (ref 0.44–1.00)
GFR, Estimated: 24 mL/min — ABNORMAL LOW (ref >60)
Glucose, Bld: 201 mg/dL — ABNORMAL HIGH (ref 70–99)
Potassium: 4.4 mmol/L (ref 3.5–5.1)
Sodium: 139 mmol/L (ref 135–145)

## 2023-06-25 LAB — HEPATITIS PANEL, ACUTE
HCV Ab: NONREACTIVE
Hep A IgM: NONREACTIVE
Hep B C IgM: NONREACTIVE
Hepatitis B Surface Ag: NONREACTIVE

## 2023-06-25 LAB — VITAMIN B12: Vitamin B-12: 331 pg/mL (ref 180–914)

## 2023-06-25 MED ORDER — SODIUM CHLORIDE 0.9 % IV SOLN: INTRAVENOUS | Status: AC

## 2023-06-25 MED ORDER — PEG 3350-KCL-NA BICARB-NACL 420 G PO SOLR
4000.0000 mL | Freq: Once | ORAL | Status: AC
  Administered 2023-06-25: 4000 mL via ORAL
  Filled 2023-06-25: qty 4000

## 2023-06-25 NOTE — Consult Note (Signed)
 Marnee Sink, MD [facility]  [address] [place], [place] [address] Phone: [phone] Fax : [phone]  Consultation  Referring Provider:     Dr. Rosalea Collin Primary Care Physician:  Melchor Spoon, MD Primary Gastroenterologist:  [facility]         Reason for Consultation:     Abdominal pain with rectal bleeding  Date of Admission:  06/24/2023 Date of Consultation:  06/25/2023         HPI:   Jacqueline Carney is a 89 y.o. female who was admitted yesterday after abdominal pain for the last 5 days.  She states the abdominal pain started with mild pain and it progressively got worse.  The patient had a colonoscopy in 2019 by Dr. Felicita Horns who reported the colon to be torturous and difficult to transverse.  The patient now comes in with the abdominal pain that progressed to rectal bleeding this morning.  The patient has a history of hypertension, GERD, depression, chronic kidney disease, urinary incontinence and colon polyps.  She reports that she has had a history of nausea and vomiting for some time but it has gotten worse with this recent bout of abdominal pain.  The vomitus does not have any report of any blood in it.  The patient's blood count was checked after her episode of bleeding that showed:  Component     Latest Ref Rng 06/24/2023 06/25/2023  Hemoglobin     12.0 - 15.0 g/dL 13.0 (H)  86.5 (H)   HCT     36.0 - 46.0 % 46.1 (H)  45.6     Due to the report of 10 out of 10 pain the patient underwent a KUB that did not show any abnormalities.  The patient did have a CT scan on admission that showed:  IMPRESSION: 1. Large amount of stool mixed with fluid within the ascending, transverse, descending and proximal sigmoid colon. Abrupt appearing caliber change at the distal sigmoid colon with no stool or fluid distal to this, benign or malignant stricture cannot be excluded at this location. Correlation with colonoscopy is recommended. There is some wall thickening and  pericolonic edema involving the splenic flexure and descending colon proximal to the caliber change suggesting colon inflammation. There is no intramural air or free air. No dilated small bowel is seen. 2. Small free fluid in the abdomen and pelvis. 3. Aortic atherosclerosis.   Aortic Atherosclerosis     Past Medical History:  Diagnosis Date   Bladder incontinence    Cancer (HCC)    skin   GERD (gastroesophageal reflux disease)    H/O degenerative disc disease    Hypercholesterolemia    Hypertension    Palpitations 09/2010   stress echo negative for ischemia, holter without sigmificant abnormality   Sensorineural hearing loss (SNHL) of both ears    Vascular ectasia of colon    Vitamin D deficiency, unspecified     Past Surgical History:  Procedure Laterality Date   ABDOMINAL HYSTERECTOMY     BACK SURGERY     BROW LIFT Bilateral 01/03/2023   Procedure: BLEPHAROPLASTY UPPER EYELID; W/EXCESS SKIN BILATERAL;  Surgeon: Zacarias Hermann, MD;  Location: [facility];  Service: Ophthalmology;  Laterality: Bilateral;   CATARACT EXTRACTION W/PHACO Left 10/04/2021   Procedure: CATARACT EXTRACTION PHACO AND INTRAOCULAR LENS PLACEMENT (IOC) LEFT 6.09 00:49.9;  Surgeon: Annell Kidney, MD;  Location: [facility];  Service: Ophthalmology;  Laterality: Left;   CATARACT EXTRACTION W/PHACO  Right 10/18/2021   Procedure: CATARACT EXTRACTION PHACO AND INTRAOCULAR LENS PLACEMENT (IOC) RIGHT;  Surgeon: Annell Kidney, MD;  Location: [facility];  Service: Ophthalmology;  Laterality: Right;  10.59 1:23.2   CHOLECYSTECTOMY     COLONOSCOPY     with colon polyps   COLONOSCOPY WITH PROPOFOL  N/A 07/26/2017   Procedure: COLONOSCOPY WITH PROPOFOL ;  Surgeon: Cassie Click, MD;  Location: [facility];  Service: Endoscopy;  Laterality: N/A;    Prior to Admission medications   Medication Sig Start Date End Date Taking? Authorizing Provider  amLODipine  (NORVASC ) 10 MG  tablet Take 10 mg by mouth daily. 05/28/23 05/27/24 Yes [provider]  Calcium  Carbonate-Vitamin D 600-400 MG-UNIT tablet Take 1 tablet by mouth daily.   Yes [provider]  escitalopram  (LEXAPRO ) 5 MG tablet Take 5 mg by mouth daily. 05/28/23  Yes [provider]  pantoprazole  (PROTONIX ) 40 MG tablet Take 40 mg by mouth daily.   Yes [provider]  Vibegron  (GEMTESA ) 75 MG TABS Take 1 tablet (75 mg total) by mouth daily. 05/20/23  Yes MacDiarmid, Geralyn Knee, MD    Family History  Problem Relation Age of Onset   Colon cancer Mother    Breast cancer Neg Hx      Social History   Tobacco Use   Smoking status: Never   Smokeless tobacco: Never  Vaping Use   Vaping status: Never Used  Substance Use Topics   Alcohol use: Never   Drug use: Never    Allergies as of 06/24/2023   (No Known Allergies)    Review of Systems:    All systems reviewed and negative except where noted in HPI.   Physical Exam:  Vital signs in last 24 hours: Temp:  [97.7 F (36.5 C)-98.4 F (36.9 C)] 97.7 F (36.5 C) (04/22 0400) Pulse Rate:  [90-98] 93 (04/22 0730) Resp:  [15-24] 23 (04/22 0730) BP: (94-155)/(48-77) 144/63 (04/22 0730) SpO2:  [93 %-100 %] 95 % (04/22 0730) Weight:  [81 kg] 81 kg (04/22 0800) Last BM Date : 06/25/23 General:   Pleasant, cooperative in NAD Head:  Normocephalic and atraumatic. Eyes:   No icterus.   Conjunctiva pink. PERRLA. Ears:  Normal auditory acuity. Neck:  Supple; no masses or thyroidomegaly Lungs: Respirations even and unlabored. Lungs clear to auscultation bilaterally.   No wheezes, crackles, or rhonchi.  Heart:  Regular rate and rhythm;  Without murmur, clicks, rubs or gallops Abdomen:  Soft, nondistended, diffusely tender. Normal bowel sounds. No appreciable masses or hepatomegaly.  No rebound or guarding.  Rectal:  Not performed. Msk:  Symmetrical without gross deformities.   Extremities:  Without edema, cyanosis or  clubbing. Neurologic:  Alert and oriented x3;  grossly normal neurologically. Skin:  Intact without significant lesions or rashes. Cervical Nodes:  No significant cervical adenopathy. Psych:  Alert and cooperative. Normal affect.  LAB RESULTS: Recent Labs    06/24/23 1244 06/25/23 0609  WBC 13.9* 12.3*  HGB 16.0* 16.0*  HCT 46.1* 45.6  PLT 231 207   BMET Recent Labs    06/24/23 1342 06/25/23 0609  NA 139 139  K 4.9 4.4  CL 102 105  CO2 23 22  GLUCOSE 208* 201*  BUN 29* 46*  CREATININE 1.62* 1.96*  CALCIUM  10.2 8.5*   LFT Recent Labs    06/24/23 1342  PROT 7.2  ALBUMIN 4.3  AST 74*  ALT 42  ALKPHOS 108  BILITOT 2.1*   PT/INR Recent Labs    06/24/23  2217  LABPROT 13.8  INR 1.0    STUDIES: DG Abd 1 View Result Date: 06/25/2023 CLINICAL DATA:  Abdominal pain EXAM: ABDOMEN - 1 VIEW COMPARISON:  CT from the previous day. FINDINGS: Scattered large and small bowel gas is noted. No obstructive changes are seen. Contrast is noted within the bladder related to the recent CT. No free air is seen. No acute bony abnormality noted. IMPRESSION: No acute abnormality noted. Electronically Signed   By: Violeta Grey M.D.   On: 06/25/2023 10:47   CT ABDOMEN PELVIS W CONTRAST Result Date: 06/24/2023 CLINICAL DATA:  Sharp abdominal pain with nausea and vomiting EXAM: CT ABDOMEN AND PELVIS WITH CONTRAST TECHNIQUE: Multidetector CT imaging of the abdomen and pelvis was performed using the standard protocol following bolus administration of intravenous contrast. RADIATION DOSE REDUCTION: This exam was performed according to the departmental dose-optimization program which includes automated exposure control, adjustment of the mA and/or kV according to patient size and/or use of iterative reconstruction technique. CONTRAST:  80mL OMNIPAQUE  IOHEXOL  300 MG/ML  SOLN COMPARISON:  CT 11/25/2009 FINDINGS: Lower chest: Lung bases demonstrate no acute airspace disease. Hepatobiliary:  Cholecystectomy. Subcentimeter hypodensity within the right hepatic lobe too small to further characterize. No biliary dilatation Pancreas: Unremarkable. No pancreatic ductal dilatation or surrounding inflammatory changes. Spleen: Normal in size without focal abnormality. Adrenals/Urinary Tract: Adrenal glands are normal. Kidneys show no hydronephrosis. Subcentimeter hypodensities too small to further characterize, no specific imaging follow-up is recommended. The bladder is normal Stomach/Bowel: The stomach is nonenlarged. There is no dilated small bowel. Large amount of stool mixed with fluid, within the ascending, transverse descending and proximal sigmoid colon. Distal sigmoid colon and rectosigmoid colon appear decompressed. Suspicion of slight colon wall thickening and pericolonic edema at the descending and proximal sigmoid colon, series 2 image 53 through 68. Vascular/Lymphatic: Aortic atherosclerosis. No enlarged abdominal or pelvic lymph nodes. Reproductive: Status post hysterectomy. No adnexal masses. Other: No free air.  Small volume free fluid in the pelvis. Musculoskeletal: No acute or suspicious osseous abnormality. IMPRESSION: 1. Large amount of stool mixed with fluid within the ascending, transverse, descending and proximal sigmoid colon. Abrupt appearing caliber change at the distal sigmoid colon with no stool or fluid distal to this, benign or malignant stricture cannot be excluded at this location. Correlation with colonoscopy is recommended. There is some wall thickening and pericolonic edema involving the splenic flexure and descending colon proximal to the caliber change suggesting colon inflammation. There is no intramural air or free air. No dilated small bowel is seen. 2. Small free fluid in the abdomen and pelvis. 3. Aortic atherosclerosis. Aortic Atherosclerosis (ICD10-I70.0). Electronically Signed   By: Esmeralda Hedge M.D.   On: 06/24/2023 19:12      Impression / Plan:    Assessment: Principal Problem:   Abdominal pain Active Problems:   Hypertension   Prediabetes   Acute renal failure superimposed on stage 3a chronic kidney disease (HCC)   Leukocytosis   Depression   Abnormal LFTs   Peripheral neuropathy   Jacqueline Carney is a 89 y.o. y/o female with with a history of of intermittent nausea vomiting who came with abdominal pain with rectal bleeding.  The patient has a history of a colonoscopy 2019 with a torturous sigmoid colon transversed with the pediatric scope.  The patient now comes in with a abnormal CT scan showing a possible stricture versus mass in the sigmoid colon with retained stool.  The patient had rectal bleeding today that may be  due to ischemic colitis versus a bleeding neoplasm versus a diverticular bleed.  Plan:  The patient will be given a prep today for a possible colonoscopy for tomorrow.  The patient reports that in the past she had some relief of the pain when she moves her bowels.  She has been told to take the prep slowly so she does not get nauseous and vomit the prep up.  The patient and her daughter have been explained the plan and agree with it.  Thank you for involving me in the care of this patient.      LOS: 0 days   Marnee Sink, MD, MD. Sylvan Evener 06/25/2023, 12:00 PM,  Pager [phone] 7am-5pm  Check AMION for 5pm -7am coverage and on weekends   Note: This dictation was prepared with Dragon dictation along with smaller phrase technology. Any transcriptional errors that result from this process are unintentional.

## 2023-06-25 NOTE — Plan of Care (Signed)

## 2023-06-25 NOTE — ED Notes (Signed)
 Patient had a large watery stool occurrence at this time. Patient states that she feels like it just keeps coming. Patient cleaned up. Pure wick changed, brief changed, new chicks pads placed under patient, and new warm blankets provided.

## 2023-06-25 NOTE — Progress Notes (Signed)
 PROGRESS NOTE    Jacqueline Carney   ZOX:[id number] DOB: [birth year]  DOA: 06/24/2023 Date of Service: 06/25/23 which is hospital day 0  PCP: Melchor Spoon, MD    Hospital course / significant events:   HPI:  Jacqueline Carney is a 89 y.o. female with medical history significant of HTN, HLD, prediabetes, GERD, depression, CKD-3A, strokelike symptoms, degenerative disc disease, vascular ectasia of colon, urinary incontinence, skin cancer, colon polyp, who presents with nausea, vomiting, abdominal pain.   04/21: CT scan showed large amount of stool and fluid throughout the colon abruptly ending in the distal sigmoid colon where there was a caliber change, concerning for possible stricture or malignancy. Admitted to hospitalist, consulted GI. VS and Hgb ok 04/22: severe abd pain this AM, KUB eval for perf and neg for this. Per GI, prep today for a possible colonoscopy for tomorrow      Consultants:  Gastroenterology   Procedures/Surgeries:  None       ASSESSMENT & PLAN:   Abdominal pain CT scan showed large amount of stool and fluid throughout the colon abruptly ending in the distal sigmoid colon where there was a caliber change, concerning for possible stricture or malignancy.  Symptom control for nausea/pain IV fluids GI following  Plan colonoscopy tomorrow 04/23   Leukocytosis WBC 13.9.  No fever.   Likely reactive Follow CBC   Essential Hypertension Blood pressures soft initially but now improved  IV hydralazine  as needed Decrease amlodipine  dose from 10 to 5 mg daily   Prediabetes Recent A1c 5.4, well-controlled.  Patient is not taking medications. Follow Glc w/ routine am labs   Acute renal failure superimposed on stage 3a chronic kidney disease Likely due to dehydration, no hydronephrosis on CT scan. Avoid using renal toxic medications IV fluid as above Follow BMP   Mild abnormal LFTs Check hepatitis panel Judicious use Tylenol , 325 mg as  needed Follow hepatic labs    Possible peripheral neuropathy Patient reports tingling and numbness in both hands and feet. Neurontin  empirically 100 mg every night TSH wnl vitamin B12 level pending   Depression Lexapro     Class 1 obesity based on BMI: Body mass index is 29.72 kg/m.Jacqueline Carney Significantly low or high BMI is associated with higher medical risk.  Underweight - under 18  overweight - 25 to 29 obese - 30 or more Class 1 obesity: BMI of 30.0 to 34 Class 2 obesity: BMI of 35.0 to 39 Class 3 obesity: BMI of 40.0 to 49 Super Morbid Obesity: BMI 50-59 Super-super Morbid Obesity: BMI 60+ Healthy nutrition and physical activity advised as adjunct to other disease management and risk reduction treatments    DVT prophylaxis: no Rx PPx d/t concern for lower GI bleeding IV fluids: normal saline continuous IV fluids  Nutrition: NPO except meds/sips Central lines / other devices: none  Code Status: FULL CODE ACP documentation reviewed:  none on file in [facility]  TOC needs: TBD Medical barriers to dispo: colonoscopy tomorrow. Expected medical readiness for discharge pending scope findings.              Subjective / Brief ROS:  Patient reports abd pain especially lower abdomen No nausea/vomiting Denies CP/SOB.  Pain controlled.  Denies new weakness.  Reports no concerns w/ urination RN reports soft/loose bloody appearing stool no frank bleeding  Family Communication: family at bedside on rounds    Objective Findings:  Vitals:   06/25/23 0630 06/25/23 0700 06/25/23 0730 06/25/23 0800  BP: (!) 151/62 Jacqueline Carney)  148/57 (!) 144/63   Pulse: 90 93 93   Resp: (!) 24 (!) 21 (!) 23   Temp:      TempSrc:      SpO2: 97% 95% 95%   Weight:    81 kg   No intake or output data in the 24 hours ending 06/25/23 1433 Filed Weights   06/25/23 0800  Weight: 81 kg    Examination:  Physical Exam Constitutional:      General: She is not in acute distress.    Appearance: She is  not ill-appearing or toxic-appearing.  Cardiovascular:     Rate and Rhythm: Normal rate and regular rhythm.  Pulmonary:     Effort: Pulmonary effort is normal.     Breath sounds: Normal breath sounds.  Abdominal:     General: Bowel sounds are normal. There is no distension.     Palpations: Abdomen is soft.     Tenderness: There is abdominal tenderness in the right lower quadrant and left lower quadrant. There is no rebound.  Skin:    General: Skin is warm and dry.  Neurological:     General: No focal deficit present.     Mental Status: She is alert and oriented to person, place, and time.  Psychiatric:        Mood and Affect: Mood normal.        Behavior: Behavior normal.          Scheduled Medications:   amLODipine   5 mg Oral Daily   calcium -vitamin D  1 tablet Oral Daily   escitalopram   5 mg Oral Daily   gabapentin   100 mg Oral QHS   mirabegron  ER  25 mg Oral Daily   pantoprazole   40 mg Oral Daily   polyethylene glycol-electrolytes  4,000 mL Oral Once    Continuous Infusions:  sodium chloride  100 mL/hr at 06/25/23 0853    PRN Medications:  acetaminophen , hydrALAZINE , morphine  injection, ondansetron  (ZOFRAN ) IV, oxyCODONE   Antimicrobials from admission:  Anti-infectives (From admission, onward)    None           Data Reviewed:  I have personally reviewed the following...  CBC: Recent Labs  Lab 06/24/23 1244 06/25/23 0609  WBC 13.9* 12.3*  HGB 16.0* 16.0*  HCT 46.1* 45.6  MCV 90.4 92.9  PLT 231 207   Basic Metabolic Panel: Recent Labs  Lab 06/24/23 1342 06/25/23 0609  NA 139 139  K 4.9 4.4  CL 102 105  CO2 23 22  GLUCOSE 208* 201*  BUN 29* 46*  CREATININE 1.62* 1.96*  CALCIUM  10.2 8.5*   GFR: Estimated Creatinine Clearance: 21.3 mL/min (A) (by C-G formula based on SCr of 1.96 mg/dL (H)). Liver Function Tests: Recent Labs  Lab 06/24/23 1342  AST 74*  ALT 42  ALKPHOS 108  BILITOT 2.1*  PROT 7.2  ALBUMIN 4.3   Recent Labs   Lab 06/24/23 1342  LIPASE 35   No results for input(s): "AMMONIA" in the last 168 hours. Coagulation Profile: Recent Labs  Lab 06/24/23 2217  INR 1.0   Cardiac Enzymes: No results for input(s): "CKTOTAL", "CKMB", "CKMBINDEX", "TROPONINI" in the last 168 hours. BNP (last 3 results) No results for input(s): "PROBNP" in the last 8760 hours. HbA1C: No results for input(s): "HGBA1C" in the last 72 hours. CBG: No results for input(s): "GLUCAP" in the last 168 hours. Lipid Profile: No results for input(s): "CHOL", "HDL", "LDLCALC", "TRIG", "CHOLHDL", "LDLDIRECT" in the last 72 hours. Thyroid Function Tests: Recent Labs  06/24/23 1342  TSH 3.926   Anemia Panel: Recent Labs    06/24/23 2217  VITAMINB12 331   Most Recent Urinalysis On File:     Component Value Date/Time   COLORURINE YELLOW (A) 12/09/2017 0939   APPEARANCEUR HAZY (A) 12/09/2017 0939   LABSPEC 1.014 12/09/2017 0939   PHURINE 6.0 12/09/2017 0939   GLUCOSEU NEGATIVE 12/09/2017 0939   HGBUR NEGATIVE 12/09/2017 0939   BILIRUBINUR NEGATIVE 12/09/2017 0939   KETONESUR NEGATIVE 12/09/2017 0939   PROTEINUR NEGATIVE 12/09/2017 0939   NITRITE NEGATIVE 12/09/2017 0939   LEUKOCYTESUR NEGATIVE 12/09/2017 0939   Sepsis Labs: @LABRCNTIP (procalcitonin:4,lacticidven:4) Microbiology: No results found for this or any previous visit (from the past 240 hours).    Radiology Studies last 3 days: DG Abd 1 View Result Date: 06/25/2023 CLINICAL DATA:  Abdominal pain EXAM: ABDOMEN - 1 VIEW COMPARISON:  CT from the previous day. FINDINGS: Scattered large and small bowel gas is noted. No obstructive changes are seen. Contrast is noted within the bladder related to the recent CT. No free air is seen. No acute bony abnormality noted. IMPRESSION: No acute abnormality noted. Electronically Signed   By: Violeta Grey M.D.   On: 06/25/2023 10:47   CT ABDOMEN PELVIS W CONTRAST Result Date: 06/24/2023 CLINICAL DATA:  Sharp abdominal  pain with nausea and vomiting EXAM: CT ABDOMEN AND PELVIS WITH CONTRAST TECHNIQUE: Multidetector CT imaging of the abdomen and pelvis was performed using the standard protocol following bolus administration of intravenous contrast. RADIATION DOSE REDUCTION: This exam was performed according to the departmental dose-optimization program which includes automated exposure control, adjustment of the mA and/or kV according to patient size and/or use of iterative reconstruction technique. CONTRAST:  80mL OMNIPAQUE  IOHEXOL  300 MG/ML  SOLN COMPARISON:  CT 11/25/2009 FINDINGS: Lower chest: Lung bases demonstrate no acute airspace disease. Hepatobiliary: Cholecystectomy. Subcentimeter hypodensity within the right hepatic lobe too small to further characterize. No biliary dilatation Pancreas: Unremarkable. No pancreatic ductal dilatation or surrounding inflammatory changes. Spleen: Normal in size without focal abnormality. Adrenals/Urinary Tract: Adrenal glands are normal. Kidneys show no hydronephrosis. Subcentimeter hypodensities too small to further characterize, no specific imaging follow-up is recommended. The bladder is normal Stomach/Bowel: The stomach is nonenlarged. There is no dilated small bowel. Large amount of stool mixed with fluid, within the ascending, transverse descending and proximal sigmoid colon. Distal sigmoid colon and rectosigmoid colon appear decompressed. Suspicion of slight colon wall thickening and pericolonic edema at the descending and proximal sigmoid colon, series 2 image 53 through 68. Vascular/Lymphatic: Aortic atherosclerosis. No enlarged abdominal or pelvic lymph nodes. Reproductive: Status post hysterectomy. No adnexal masses. Other: No free air.  Small volume free fluid in the pelvis. Musculoskeletal: No acute or suspicious osseous abnormality. IMPRESSION: 1. Large amount of stool mixed with fluid within the ascending, transverse, descending and proximal sigmoid colon. Abrupt appearing  caliber change at the distal sigmoid colon with no stool or fluid distal to this, benign or malignant stricture cannot be excluded at this location. Correlation with colonoscopy is recommended. There is some wall thickening and pericolonic edema involving the splenic flexure and descending colon proximal to the caliber change suggesting colon inflammation. There is no intramural air or free air. No dilated small bowel is seen. 2. Small free fluid in the abdomen and pelvis. 3. Aortic atherosclerosis. Aortic Atherosclerosis (ICD10-I70.0). Electronically Signed   By: Esmeralda Hedge M.D.   On: 06/24/2023 19:12         Melodi Sprung, DO [facility] 06/25/2023, 2:33 PM  Dictation software may have been used to generate the above note. Typos may occur and escape review in typed/dictated notes. Please contact Dr Authur Leghorn directly for clarity if needed.  Staff may message me via secure chat in Epic  but this may not receive an immediate response,  please page me for urgent matters!  If 7PM-7AM, please contact night coverage [email or web address]

## 2023-06-25 NOTE — Hospital Course (Addendum)
 Hospital course / significant events:   HPI:  Jacqueline Carney is a 89 y.o. female with medical history significant of HTN, HLD, prediabetes, GERD, depression, CKD-3A, strokelike symptoms, degenerative disc disease, vascular ectasia of colon, urinary incontinence, skin cancer, colon polyp, who presents with nausea, vomiting, abdominal pain.   04/21: CT scan showed large amount of stool and fluid throughout the colon abruptly ending in the distal sigmoid colon where there was a caliber change, concerning for possible stricture or malignancy. Admitted to hospitalist, consulted GI. VS and Hgb ok 04/22: severe abd pain this AM, KUB eval for perf and neg for this. Per GI, prep today for a possible colonoscopy for tomorrow      Consultants:  Gastroenterology   Procedures/Surgeries:  None       ASSESSMENT & PLAN:   Abdominal pain CT scan showed large amount of stool and fluid throughout the colon abruptly ending in the distal sigmoid colon where there was a caliber change, concerning for possible stricture or malignancy.  Symptom control for nausea/pain IV fluids GI following  Plan colonoscopy tomorrow 04/23   Leukocytosis WBC 13.9.  No fever.   Likely reactive Follow CBC   Essential Hypertension Blood pressures soft initially but now improved  IV hydralazine  as needed Decrease amlodipine  dose from 10 to 5 mg daily   Prediabetes Recent A1c 5.4, well-controlled.  Patient is not taking medications. Follow Glc w/ routine am labs   Acute renal failure superimposed on stage 3a chronic kidney disease Likely due to dehydration, no hydronephrosis on CT scan. Avoid using renal toxic medications IV fluid as above Follow BMP   Mild abnormal LFTs Check hepatitis panel Judicious use Tylenol , 325 mg as needed Follow hepatic labs    Possible peripheral neuropathy Patient reports tingling and numbness in both hands and feet. Neurontin  empirically 100 mg every night TSH  wnl vitamin B12 level pending   Depression Lexapro     Class 1 obesity based on BMI: Body mass index is 29.72 kg/m.Aaron Aas Significantly low or high BMI is associated with higher medical risk.  Underweight - under 18  overweight - 25 to 29 obese - 30 or more Class 1 obesity: BMI of 30.0 to 34 Class 2 obesity: BMI of 35.0 to 39 Class 3 obesity: BMI of 40.0 to 49 Super Morbid Obesity: BMI 50-59 Super-super Morbid Obesity: BMI 60+ Healthy nutrition and physical activity advised as adjunct to other disease management and risk reduction treatments    DVT prophylaxis: no Rx PPx d/t concern for lower GI bleeding IV fluids: normal saline continuous IV fluids  Nutrition: NPO except meds/sips Central lines / other devices: none  Code Status: FULL CODE ACP documentation reviewed:  none on file in [facility]  TOC needs: TBD Medical barriers to dispo: colonoscopy tomorrow. Expected medical readiness for discharge pending scope findings.

## 2023-06-25 NOTE — Care Management Obs Status (Signed)
 MEDICARE OBSERVATION STATUS NOTIFICATION   Patient Details  Name: GRACIN SOOHOO MRN: [id number] Date of Birth: 03-03-1934   Medicare Observation Status Notification Given:  Rudolph Cost, CMA 06/25/2023, 9:33 AM

## 2023-06-25 NOTE — ED Notes (Signed)
 100mg  of Gabapentin  (1 capsule) wasted with Delight Felts, RN

## 2023-06-26 ENCOUNTER — Encounter: Payer: Self-pay | Admitting: Osteopathic Medicine

## 2023-06-26 ENCOUNTER — Other Ambulatory Visit: Payer: Self-pay

## 2023-06-26 ENCOUNTER — Encounter
Admission: EM | Disposition: A | Discharge status: Home / Self Care | Payer: Self-pay | Source: Home / Self Care | Attending: Student in an Organized Health Care Education/Training Program

## 2023-06-26 ENCOUNTER — Inpatient Hospital Stay

## 2023-06-26 DIAGNOSIS — R1030 Lower abdominal pain, unspecified: Secondary | ICD-10-CM | POA: Diagnosis not present

## 2023-06-26 DIAGNOSIS — K559 Vascular disorder of intestine, unspecified: Secondary | ICD-10-CM | POA: Diagnosis not present

## 2023-06-26 DIAGNOSIS — K56609 Unspecified intestinal obstruction, unspecified as to partial versus complete obstruction: Secondary | ICD-10-CM | POA: Diagnosis not present

## 2023-06-26 HISTORY — PX: COLONOSCOPY: SHX5424

## 2023-06-26 LAB — COMPREHENSIVE METABOLIC PANEL WITH GFR
ALT: 63 U/L — ABNORMAL HIGH (ref 0–44)
AST: 66 U/L — ABNORMAL HIGH (ref 15–41)
Albumin: 2.7 g/dL — ABNORMAL LOW (ref 3.5–5.0)
Alkaline Phosphatase: 73 U/L (ref 38–126)
Anion gap: 7 (ref 5–15)
BUN: 39 mg/dL — ABNORMAL HIGH (ref 8–23)
CO2: 22 mmol/L (ref 22–32)
Calcium: 7.8 mg/dL — ABNORMAL LOW (ref 8.9–10.3)
Chloride: 108 mmol/L (ref 98–111)
Creatinine, Ser: 1.04 mg/dL — ABNORMAL HIGH (ref 0.44–1.00)
GFR, Estimated: 52 mL/min — ABNORMAL LOW (ref >60)
Glucose, Bld: 127 mg/dL — ABNORMAL HIGH (ref 70–99)
Potassium: 3.2 mmol/L — ABNORMAL LOW (ref 3.5–5.1)
Sodium: 137 mmol/L (ref 135–145)
Total Bilirubin: 1 mg/dL (ref 0.0–1.2)
Total Protein: 5.1 g/dL — ABNORMAL LOW (ref 6.5–8.1)

## 2023-06-26 LAB — CBC
HCT: 37.8 % (ref 36.0–46.0)
Hemoglobin: 13.3 g/dL (ref 12.0–15.0)
MCH: 31.7 pg (ref 26.0–34.0)
MCHC: 35.2 g/dL (ref 30.0–36.0)
MCV: 90.2 fL (ref 80.0–100.0)
Platelets: 143 10*3/uL — ABNORMAL LOW (ref 150–400)
RBC: 4.19 MIL/uL (ref 3.87–5.11)
RDW: 12.8 % (ref 11.5–15.5)
WBC: 7.5 10*3/uL (ref 4.0–10.5)
nRBC: 0 % (ref 0.0–0.2)

## 2023-06-26 LAB — AFP TUMOR MARKER: AFP, Serum, Tumor Marker: 6.9 ng/mL (ref 0.0–8.7)

## 2023-06-26 SURGERY — COLONOSCOPY
Anesthesia: General

## 2023-06-26 MED ORDER — LACTATED RINGERS IV SOLN: INTRAVENOUS | Status: DC

## 2023-06-26 MED ORDER — ALUM & MAG HYDROXIDE-SIMETH 200-200-20 MG/5ML PO SUSP
30.0000 mL | Freq: Once | ORAL | Status: AC
Start: 2023-06-26 — End: 2023-06-26
  Administered 2023-06-26: 30 mL via ORAL
  Filled 2023-06-26: qty 30

## 2023-06-26 MED ORDER — CIPROFLOXACIN IN D5W 400 MG/200ML IV SOLN
400.0000 mg | Freq: Two times a day (BID) | INTRAVENOUS | Status: DC
  Administered 2023-06-26 – 2023-07-01 (×10): 400 mg via INTRAVENOUS
  Filled 2023-06-26 (×11): qty 200

## 2023-06-26 MED ORDER — SODIUM CHLORIDE 0.9 % IV SOLN: INTRAVENOUS | Status: DC | PRN

## 2023-06-26 MED ORDER — LIDOCAINE HCL (CARDIAC) PF 100 MG/5ML IV SOSY
PREFILLED_SYRINGE | INTRAVENOUS | Status: DC | PRN
  Administered 2023-06-26: 100 mg via INTRAVENOUS

## 2023-06-26 MED ORDER — SODIUM CHLORIDE 0.9 % IV SOLN: INTRAVENOUS | Status: DC

## 2023-06-26 MED ORDER — POTASSIUM CHLORIDE 10 MEQ/100ML IV SOLN
10.0000 meq | INTRAVENOUS | Status: AC
  Administered 2023-06-26 (×4): 10 meq via INTRAVENOUS
  Filled 2023-06-26 (×4): qty 100

## 2023-06-26 MED ORDER — PROPOFOL 10 MG/ML IV BOLUS
INTRAVENOUS | Status: DC | PRN
  Administered 2023-06-26: 100 ug/kg/min via INTRAVENOUS
  Administered 2023-06-26: 70 mg via INTRAVENOUS

## 2023-06-26 MED ORDER — METRONIDAZOLE 500 MG/100ML IV SOLN
500.0000 mg | Freq: Two times a day (BID) | INTRAVENOUS | Status: DC
  Administered 2023-06-26 – 2023-07-01 (×10): 500 mg via INTRAVENOUS
  Filled 2023-06-26 (×10): qty 100

## 2023-06-26 MED ORDER — AMLODIPINE BESYLATE 10 MG PO TABS
10.0000 mg | ORAL_TABLET | Freq: Every day | ORAL | Status: DC
  Administered 2023-06-27 – 2023-07-01 (×5): 10 mg via ORAL
  Filled 2023-06-26 (×5): qty 1

## 2023-06-26 NOTE — Op Note (Signed)
 [facility] Gastroenterology Patient Name: Jacqueline Carney Procedure Date: 06/26/2023 2:14 PM MRN: [id number] Account #: [id number] Date of Birth: 01-19-1934 Admit Type: Outpatient Age: 89 Room: [facility] ENDO ROOM 2 Gender: Female Note Status: Finalized Instrument Name: Hyman Main [id number] Procedure:             Colonoscopy Indications:           Generalized abdominal pain Providers:             Marnee Sink MD, MD Medicines:             Propofol  per Anesthesia Complications:         No immediate complications. Procedure:             Pre-Anesthesia Assessment:                        - Prior to the procedure, a History and Physical was                         performed, and patient medications and allergies were                         reviewed. The patient's tolerance of previous                         anesthesia was also reviewed. The risks and benefits                         of the procedure and the sedation options and risks                         were discussed with the patient. All questions were                         answered, and informed consent was obtained. Prior                         Anticoagulants: The patient has taken no anticoagulant                         or antiplatelet agents. ASA Grade Assessment: II - A                         patient with mild systemic disease. After reviewing                         the risks and benefits, the patient was deemed in                         satisfactory condition to undergo the procedure.                        After obtaining informed consent, the colonoscope was                         passed under direct vision. Throughout the procedure,                         the patient's blood pressure, pulse,  and oxygen                         saturations were monitored continuously. The                         Colonoscope was introduced through the anus with the                         intention of advancing to  the cecum. The scope was                         advanced to the transverse colon before the procedure                         was aborted. Medications were given. The colonoscopy                         was performed without difficulty. The patient                         tolerated the procedure well. The quality of the bowel                         preparation was good. Findings:      The perianal and digital rectal examinations were normal.      Segmental severe mucosal changes characterized by ischemia were found in       the descending colon, at the splenic flexure and in the transverse colon. Impression:            - Segmental severe mucosal changes were found in the                         descending colon, at the splenic flexure and in the                         transverse colon secondary to ischemic colitis.                        - No specimens collected. Recommendation:        - Return patient to hospital ward for ongoing care.                        - Resume previous diet.                        - Continue present medications.                        - Consider vascular and general surgery consults. Procedure Code(s):     --- Professional ---                        [phone], 53, Colonoscopy, flexible; diagnostic,                         including collection of specimen(s) by brushing or  washing, when performed (separate procedure) Diagnosis Code(s):     --- Professional ---                        R10.84, Generalized abdominal pain                        K55.9, Vascular disorder of intestine, unspecified CPT copyright 2022 [employer]. All rights reserved. The codes documented in this report are preliminary and upon coder review may  be revised to meet current compliance requirements. Marnee Sink MD, MD 06/26/2023 2:36:26 PM This report has been signed electronically. Number of Addenda: 0 Note Initiated On: 06/26/2023 2:14 PM Total  Procedure Duration: 0 hours 6 minutes 47 seconds  Estimated Blood Loss:  Estimated blood loss: none.      [facility]
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
## 2023-06-26 NOTE — Plan of Care (Signed)
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
## 2023-06-26 NOTE — Transfer of Care (Signed)
 Immediate Anesthesia Transfer of Care Note  Patient: Jacqueline Carney  Procedure(s) Performed: COLONOSCOPY  Patient Location: Endoscopy Unit  Anesthesia Type:General  Level of Consciousness: drowsy  Airway & Oxygen Therapy: Patient Spontanous Breathing and Patient connected to face mask oxygen  Post-op Assessment: Report given to RN and Post -op Vital signs reviewed and stable  Post vital signs: Reviewed and stable  Last Vitals:  Vitals Value Taken Time  BP 138/58 06/26/23 1434  Temp 35.8 1434  Pulse 86 06/26/23 1434  Resp 20 06/26/23 1434  SpO2 98 % 06/26/23 1434  Vitals shown include unfiled device data.  Last Pain:  Vitals:   06/26/23 1329  TempSrc: Temporal  PainSc: 0-No pain         Complications: No notable events documented.

## 2023-06-26 NOTE — Progress Notes (Signed)
 PROGRESS NOTE  Jacqueline Carney    DOB: 24-Feb-1934, 89 y.o.  WUJ:[id number]    Code Status: Full Code   DOA: 06/24/2023   LOS: 1   Brief hospital course  Jacqueline Carney is a 89 y.o. female with medical history significant of HTN, HLD, prediabetes, GERD, depression, CKD-3A, strokelike symptoms, degenerative disc disease, vascular ectasia of colon, urinary incontinence, skin cancer, colon polyp, who presents with nausea, vomiting, abdominal pain.    04/21: CT scan showed large amount of stool and fluid throughout the colon abruptly ending in the distal sigmoid colon where there was a caliber change, concerning for possible stricture or malignancy. Admitted to hospitalist, consulted GI. VS and Hgb ok  06/26/23 -colonoscopy today  Assessment & Plan  Principal Problem:   Abdominal pain Active Problems:   Leukocytosis   Hypertension   Prediabetes   Acute renal failure superimposed on stage 3a chronic kidney disease (HCC)   Abnormal LFTs   Peripheral neuropathy   Depression   Hematochezia  Abdominal pain CT scan showed large amount of stool and fluid throughout the colon abruptly ending in the distal sigmoid colon where there was a caliber change, concerning for possible stricture or malignancy.  Symptom control for nausea/pain IV fluids GI following  colonoscopy 04/23   Essential Hypertension Continue home amlodipine  dose from 10mg  daily   Prediabetes Recent A1c 5.4, well-controlled.  Patient is not taking medications. Follow Glc w/ routine am labs   Acute renal failure superimposed on stage 3a chronic kidney disease Likely due to dehydration, no hydronephrosis on CT scan. Avoid using renal toxic medications Follow BMP   Mild abnormal LFTs- improving. Negative acute hepatitis panel Follow hepatic labs    Possible peripheral neuropathy- chronic and unchanged Patient reports tingling and numbness in both hands and feet. Neurontin  empirically 100 mg every night TSH  wnl vitamin B12 level pending   Depression Lexapro     Class 1 obesity based on BMI: Body mass index is 29.72 kg/m.  VTE ppx: SCDs Start: 06/24/23 2027  Diet:     Diet   Diet NPO time specified Except for: Sips with Meds, Ice Chips   Consultants: GI  Subjective 06/26/23    Pt reports feeling overall fine this morning. Had several bowel movements overnight and liquidy this morning. Having some lower abdominal discomfort. Anxious about the procedure.    Objective   Vitals:   06/25/23 0800 06/25/23 1645 06/25/23 2005 06/26/23 0451  BP:  (!) 160/59 131/63 (!) 148/60  Pulse:  100 (!) 107 (!) 102  Resp:  18  17  Temp:  98.8 F (37.1 C)  98.3 F (36.8 C)  TempSrc:    Oral  SpO2:  96% 95% 95%  Weight: 81 kg       Intake/Output Summary (Last 24 hours) at 06/26/2023 0733 Last data filed at 06/25/2023 1731 Gross per 24 hour  Intake 852.03 ml  Output 400 ml  Net 452.03 ml   Filed Weights   06/25/23 0800  Weight: 81 kg     Physical Exam:  General: awake, alert, NAD Respiratory: normal respiratory effort. Cardiovascular: quick capillary refill, normal S1/S2, RRR, no JVD, murmurs Gastrointestinal: soft, NT, ND Nervous: A&O x3. no gross focal neurologic deficits, normal speech Extremities: moves all equally, no edema, normal tone Skin: dry, intact, normal temperature, normal color. No rashes, lesions or ulcers on exposed skin Psychiatry: normal mood, congruent affect  Labs   I have personally reviewed the following labs and imaging studies  CBC    Component Value Date/Time   WBC 7.5 06/26/2023 0404   RBC 4.19 06/26/2023 0404   HGB 13.3 06/26/2023 0404   HCT 37.8 06/26/2023 0404   PLT 143 (L) 06/26/2023 0404   MCV 90.2 06/26/2023 0404   MCH 31.7 06/26/2023 0404   MCHC 35.2 06/26/2023 0404   RDW 12.8 06/26/2023 0404   LYMPHSABS 1.8 12/09/2017 0939   MONOABS 0.4 12/09/2017 0939   EOSABS 0.1 12/09/2017 0939   BASOSABS 0.0 12/09/2017 0939      Latest Ref Rng &  Units 06/26/2023    4:04 AM 06/25/2023    6:09 AM 06/24/2023    1:42 PM  BMP  Glucose 70 - 99 mg/dL 811  914  782   BUN 8 - 23 mg/dL 39  46  29   Creatinine 0.44 - 1.00 mg/dL 9.56  2.13  0.86   Sodium 135 - 145 mmol/L 137  139  139   Potassium 3.5 - 5.1 mmol/L 3.2  4.4  4.9   Chloride 98 - 111 mmol/L 108  105  102   CO2 22 - 32 mmol/L 22  22  23    Calcium  8.9 - 10.3 mg/dL 7.8  8.5  57.8     DG Abd 1 View Result Date: 06/25/2023 CLINICAL DATA:  Abdominal pain EXAM: ABDOMEN - 1 VIEW COMPARISON:  CT from the previous day. FINDINGS: Scattered large and small bowel gas is noted. No obstructive changes are seen. Contrast is noted within the bladder related to the recent CT. No free air is seen. No acute bony abnormality noted. IMPRESSION: No acute abnormality noted. Electronically Signed   By: Violeta Grey M.D.   On: 06/25/2023 10:47   CT ABDOMEN PELVIS W CONTRAST Result Date: 06/24/2023 CLINICAL DATA:  Sharp abdominal pain with nausea and vomiting EXAM: CT ABDOMEN AND PELVIS WITH CONTRAST TECHNIQUE: Multidetector CT imaging of the abdomen and pelvis was performed using the standard protocol following bolus administration of intravenous contrast. RADIATION DOSE REDUCTION: This exam was performed according to the departmental dose-optimization program which includes automated exposure control, adjustment of the mA and/or kV according to patient size and/or use of iterative reconstruction technique. CONTRAST:  80mL OMNIPAQUE  IOHEXOL  300 MG/ML  SOLN COMPARISON:  CT 11/25/2009 FINDINGS: Lower chest: Lung bases demonstrate no acute airspace disease. Hepatobiliary: Cholecystectomy. Subcentimeter hypodensity within the right hepatic lobe too small to further characterize. No biliary dilatation Pancreas: Unremarkable. No pancreatic ductal dilatation or surrounding inflammatory changes. Spleen: Normal in size without focal abnormality. Adrenals/Urinary Tract: Adrenal glands are normal. Kidneys show no  hydronephrosis. Subcentimeter hypodensities too small to further characterize, no specific imaging follow-up is recommended. The bladder is normal Stomach/Bowel: The stomach is nonenlarged. There is no dilated small bowel. Large amount of stool mixed with fluid, within the ascending, transverse descending and proximal sigmoid colon. Distal sigmoid colon and rectosigmoid colon appear decompressed. Suspicion of slight colon wall thickening and pericolonic edema at the descending and proximal sigmoid colon, series 2 image 53 through 68. Vascular/Lymphatic: Aortic atherosclerosis. No enlarged abdominal or pelvic lymph nodes. Reproductive: Status post hysterectomy. No adnexal masses. Other: No free air.  Small volume free fluid in the pelvis. Musculoskeletal: No acute or suspicious osseous abnormality. IMPRESSION: 1. Large amount of stool mixed with fluid within the ascending, transverse, descending and proximal sigmoid colon. Abrupt appearing caliber change at the distal sigmoid colon with no stool or fluid distal to this, benign or malignant stricture cannot be excluded at this location. Correlation  with colonoscopy is recommended. There is some wall thickening and pericolonic edema involving the splenic flexure and descending colon proximal to the caliber change suggesting colon inflammation. There is no intramural air or free air. No dilated small bowel is seen. 2. Small free fluid in the abdomen and pelvis. 3. Aortic atherosclerosis. Aortic Atherosclerosis (ICD10-I70.0). Electronically Signed   By: Esmeralda Hedge M.D.   On: 06/24/2023 19:12    Disposition Plan & Communication  Patient status: Inpatient  Admitted From: Home Planned disposition location: Home health Anticipated discharge date: 4/25 pending finishing clinical workup  Family Communication: sister in law/friend at bedside    Author: Ree Candy, DO [facility] 06/26/2023, 7:33 AM   Available by Epic secure chat 7AM-7PM. If  7PM-7AM, please contact night-coverage.  [facility] contact information found on [email or web address].

## 2023-06-26 NOTE — Anesthesia Preprocedure Evaluation (Signed)
 Anesthesia Evaluation  Patient identified by MRN, date of birth, ID band Patient awake    Reviewed: Allergy & Precautions, H&P , NPO status , Patient's Chart, lab work & pertinent test results, reviewed documented beta blocker date and time   History of Anesthesia Complications Negative for: history of anesthetic complications  Airway Mallampati: II  TM Distance: >3 FB Neck ROM: full    Dental  (+) Teeth Intact, Dental Advidsory Given   Pulmonary neg pulmonary ROS   Pulmonary exam normal breath sounds clear to auscultation       Cardiovascular Exercise Tolerance: Good hypertension, On Medications (-) angina (-) Past MI and (-) Cardiac Stents Normal cardiovascular exam(-) dysrhythmias (-) Valvular Problems/Murmurs Rhythm:regular Rate:Normal     Neuro/Psych  PSYCHIATRIC DISORDERS  Depression    negative neurological ROS     GI/Hepatic negative GI ROS, Neg liver ROS,GERD  Medicated,,  Endo/Other  negative endocrine ROS    Renal/GU Renal disease (stage 3 CKD)  negative genitourinary   Musculoskeletal   Abdominal   Peds  Hematology negative hematology ROS (+)   Anesthesia Other Findings Past Medical History: No date: Bladder incontinence No date: Cancer [facility])     Comment:  skin No date: GERD (gastroesophageal reflux disease) No date: H/O degenerative disc disease No date: Hypercholesterolemia No date: Hypertension 09/2010: Palpitations     Comment:  stress echo negative for ischemia, holter without               sigmificant abnormality No date: Vascular ectasia of colon No date: Vitamin D deficiency, unspecified   Reproductive/Obstetrics negative OB ROS                             Anesthesia Physical Anesthesia Plan  ASA: 3  Anesthesia Plan: General   Post-op Pain Management:    Induction: Intravenous  PONV Risk Score and Plan: 3 and Propofol  infusion, TIVA and Treatment may  vary due to age or medical condition  Airway Management Planned: Natural Airway and Nasal Cannula  Additional Equipment:   Intra-op Plan:   Post-operative Plan:   Informed Consent: I have reviewed the patients History and Physical, chart, labs and discussed the procedure including the risks, benefits and alternatives for the proposed anesthesia with the patient or authorized representative who has indicated his/her understanding and acceptance.       Plan Discussed with: CRNA  Anesthesia Plan Comments:         Anesthesia Quick Evaluation

## 2023-06-27 ENCOUNTER — Encounter: Payer: Self-pay | Admitting: Gastroenterology

## 2023-06-27 ENCOUNTER — Inpatient Hospital Stay

## 2023-06-27 DIAGNOSIS — Z9889 Other specified postprocedural states: Secondary | ICD-10-CM | POA: Diagnosis not present

## 2023-06-27 DIAGNOSIS — I700 Atherosclerosis of aorta: Secondary | ICD-10-CM | POA: Diagnosis not present

## 2023-06-27 DIAGNOSIS — Z79899 Other long term (current) drug therapy: Secondary | ICD-10-CM

## 2023-06-27 DIAGNOSIS — R112 Nausea with vomiting, unspecified: Secondary | ICD-10-CM

## 2023-06-27 DIAGNOSIS — G629 Polyneuropathy, unspecified: Secondary | ICD-10-CM

## 2023-06-27 DIAGNOSIS — K559 Vascular disorder of intestine, unspecified: Secondary | ICD-10-CM | POA: Diagnosis not present

## 2023-06-27 LAB — GLUCOSE, CAPILLARY: Glucose-Capillary: 92 mg/dL (ref 70–99)

## 2023-06-27 NOTE — Evaluation (Signed)
 Physical Therapy Evaluation Patient Details Name: Jacqueline Carney MRN: [id number] DOB: [birth year] Today's Date: 06/27/2023  History of Present Illness  Jacqueline Carney is a 89 y.o. female with medical history significant of HTN, HLD, prediabetes, GERD, depression, CKD-3A, strokelike symptoms, degenerative disc disease, vascular ectasia of colon, urinary incontinence, skin cancer, colon polyp, who presents with nausea, vomiting, abdominal pain.  Clinical Impression  Pt admitted with above diagnosis. Pt currently with functional limitations due to the deficits listed below (see PT Problem List). Pt received upright in bed agreeable to PT with son and DIL present. Reports PTA she lives alone and is independent. Son checks in on pt daily. Per pt and family, onset of L sided weakness in hand and L knee/hip on Saturday before admission that is lingering. Per family she is pending CT scan to r/o CVA. Transport does arrive mid session leading to incomplete eval. Mod-I bed mobility with strength and sensation testing in sitting. Sensation to light touch intact in UE/LE, mild LUE drifting and weakness in shoulder flexion against gravity. Hip flexor and ankle DF weakness noted on LLE. X2 STS performed without AD then with AD. CGA and laborious to stand without AD but improved stability and ease of STS using RW demoing' SPT at [facility] and indep with urination and pericare with supervision with SPT back to EOB then return to supine. Pt left in care of transport. Discussed with family on need for f/u tomorrow for completion of eval. D/c recs and POC may change pending CT results. Pt and family agree to POC. Will update d/c recs tomorrow pending CT results but anticipate return to previous environment with family support is appropriate at this time.      If plan is discharge home, recommend the following: A little help with walking and/or transfers;Assistance with cooking/housework;Help with stairs or ramp for  entrance;A little help with bathing/dressing/bathroom   Can travel by private vehicle        Equipment Recommendations Rolling walker (2 wheels)  Recommendations for Other Services       Functional Status Assessment Patient has had a recent decline in their functional status and demonstrates the ability to make significant improvements in function in a reasonable and predictable amount of time.     Precautions / Restrictions Precautions Precautions: Fall Recall of Precautions/Restrictions: Intact Restrictions Weight Bearing Restrictions Per Provider Order: No      Mobility  Bed Mobility Overal bed mobility: Modified Independent               Patient Response: Cooperative  Transfers Overall transfer level: Needs assistance Equipment used: None, Rolling walker (2 wheels) Transfers: Sit to/from Stand, Bed to chair/wheelchair/BSC Sit to Stand: Contact guard assist   Step pivot transfers: Contact guard assist       General transfer comment: increased time and effort but completes without physical support. SPT with RW.    Ambulation/Gait               General Gait Details: unable due to transport.  Stairs            Wheelchair Mobility     Tilt Bed Tilt Bed Patient Response: Cooperative  Modified Rankin (Stroke Patients Only)       Balance Overall balance assessment: Needs assistance Sitting-balance support: Bilateral upper extremity supported, Feet supported Sitting balance-Leahy Scale: Good       Standing balance-Leahy Scale: Fair Standing balance comment: improved with RW. Poor without AD  Pertinent Vitals/Pain Pain Assessment Pain Assessment: No/denies pain    Home Living Family/patient expects to be discharged to:: Private residence Living Arrangements: Alone Available Help at Discharge: Family;Available PRN/intermittently Type of Home: House Home Access: Stairs to enter Entrance  Stairs-Rails: Doctor, general practice of Steps: 3   Home Layout: One level Home Equipment: None      Prior Function Prior Level of Function : Independent/Modified Independent               ADLs Comments: son checks in daily on pt but no support needed     Extremity/Trunk Assessment   Upper Extremity Assessment Upper Extremity Assessment: LUE deficits/detail LUE Deficits / Details: Mildly weak in shoulder flexion against gravity LUE Sensation: WNL    Lower Extremity Assessment Lower Extremity Assessment: LLE deficits/detail;RLE deficits/detail RLE Deficits / Details: WNL, 5/5 MMT hip flex, knee extension, ankl DF LLE Deficits / Details: initially unable to perform hip flexion against gravity. With 2-3 AAROM reps able to elevate 3/5 MMT. 4/5 ankle DF       Communication   Communication Communication: No apparent difficulties    Cognition Arousal: Alert Behavior During Therapy: WFL for tasks assessed/performed   PT - Cognitive impairments: No apparent impairments                         Following commands: Intact       Cueing Cueing Techniques: Verbal cues     General Comments      Exercises Other Exercises Other Exercises: discussed possible short term need for RW. POC may shift if CT scan positive for acute CVA   Assessment/Plan    PT Assessment Patient needs continued PT services  PT Problem List Decreased strength;Decreased mobility;Decreased balance       PT Treatment Interventions DME instruction;Therapeutic exercise;Gait training;Balance training;Stair training;Neuromuscular re-education;Functional mobility training;Therapeutic activities;Patient/family education    PT Goals (Current goals can be found in the Care Plan section)  Acute Rehab PT Goals Patient Stated Goal: return home and improve strength PT Goal Formulation: With patient Time For Goal Achievement: 07/11/23 Potential to Achieve Goals: Good    Frequency Min  3X/week     Co-evaluation               AM-PAC PT "6 Clicks" Mobility  Outcome Measure Help needed turning from your back to your side while in a flat bed without using bedrails?: A Little Help needed moving from lying on your back to sitting on the side of a flat bed without using bedrails?: A Little Help needed moving to and from a bed to a chair (including a wheelchair)?: A Little Help needed standing up from a chair using your arms (e.g., wheelchair or bedside chair)?: A Little Help needed to walk in hospital room?: A Little Help needed climbing 3-5 steps with a railing? : A Lot 6 Click Score: 17    End of Session Equipment Utilized During Treatment: Gait belt Activity Tolerance: Patient tolerated treatment well Patient left: in bed Nurse Communication: Mobility status PT Visit Diagnosis: Other abnormalities of gait and mobility (R26.89);Difficulty in walking, not elsewhere classified (R26.2);Muscle weakness (generalized) (M62.81)    Time: 6962-9528 PT Time Calculation (min) (ACUTE ONLY): 22 min   Charges:   PT Evaluation $PT Eval Moderate Complexity: 1 Mod   PT General Charges $$ ACUTE PT VISIT: 1 Visit        Miasia Crabtree M. Fairly IV, PT, DPT Physical Therapist- [place]  [facility]  06/27/2023, 12:15 PM

## 2023-06-27 NOTE — Plan of Care (Signed)

## 2023-06-27 NOTE — Progress Notes (Signed)
 PROGRESS NOTE  Jacqueline Carney    DOB: [birth year], 89 y.o.  QMV:[id number]    Code Status: Full Code   DOA: 06/24/2023   LOS: 2   Brief hospital course  Jacqueline Carney is a 89 y.o. female with medical history significant of HTN, HLD, prediabetes, GERD, depression, CKD-3A, strokelike symptoms, degenerative disc disease, vascular ectasia of colon, urinary incontinence, skin cancer, colon polyp, who presents with nausea, vomiting, abdominal pain.   CT scan showed large amount of stool and fluid throughout the colon abruptly ending in the distal sigmoid colon where there was a caliber change, concerning for possible stricture or malignancy. Admitted to hospitalist, consulted GI. VS and Hgb ok Colonoscopy 4/23 significant for ischemic bowel. General surgery consulted and recommended bowel rest with conservative management. Vascular did not recommend any intervention.  Advancing diet as tolerated, continuing IV Abx, fluids.   Assessment & Plan  Principal Problem:   Abdominal pain Active Problems:   Leukocytosis   Hypertension   Prediabetes   Acute renal failure superimposed on stage 3a chronic kidney disease (HCC)   Abnormal LFTs   Peripheral neuropathy   Depression   Hematochezia   Ischemic colitis (HCC)  Abdominal pain CT scan showed large amount of stool and fluid throughout the colon abruptly ending in the distal sigmoid colon where there was a caliber change, concerning for possible stricture or malignancy.  Colonoscopy 4/23 significant for ischemic bowel. General surgery consulted and recommended bowel rest with conservative management. Vascular did not recommend any intervention.  Advancing diet as tolerated, continuing IV Abx, fluids.  Symptom control for nausea/pain IV fluids GI following  colonoscopy 04/23   Essential Hypertension Continue home amlodipine  10mg  daily   Prediabetes Recent A1c 5.4, well-controlled.  Patient is not taking medications. Follow Glc w/  routine am labs   AKI superimposed on stage 3a chronic kidney disease- resolving back to baseline Avoid using renal toxic medications  Mild abnormal LFTs- improving. Negative acute hepatitis panel Follow hepatic labs    Possible peripheral neuropathy- chronic and unchanged Patient reports tingling and numbness in both hands and feet. Continues to describe intermittent tingling in her left hand and foot today which is not currently present. Describes difficulty with holding cup in left hand. Will proceed with head CT to evaluate for stroke. Exam is mostly consistent with peripheral nerve impingement.  Neurontin  empirically 100 mg every night TSH wnl vitamin B12 level pending Head CT per family and patient ongoing concerns that it's caused by stroke.  PT/OT   Depression Lexapro     Class 1 obesity based on BMI: Body mass index is 34.88 kg/m.  VTE ppx: SCDs Start: 06/24/23 2027  Diet:     Diet   Diet clear liquid Fluid consistency: Thin   Consultants: GI  Subjective 06/27/23    Pt reports feeling overall well. Continues to describe intermittent tingling in her left hand and foot which is not currently present. Describes difficulty with holding cup in left hand. Will proceed with head CT to evaluate for stroke. Exam is mostly consistent with peripheral nerve impingement.    Objective   Vitals:   06/26/23 1648 06/26/23 2019 06/27/23 0415 06/27/23 0750  BP: (!) 160/60 (!) 150/50 (!) 171/60 (!) 151/57  Pulse: 95 95 92 81  Resp: 18 18 18 16   Temp: 98.2 F (36.8 C) 98.4 F (36.9 C) 98.1 F (36.7 C) 98.2 F (36.8 C)  TempSrc: Oral Oral Oral Oral  SpO2: 97% 94% 94% 96%  Weight:  Height:        Intake/Output Summary (Last 24 hours) at 06/27/2023 0756 Last data filed at 06/27/2023 0616 Gross per 24 hour  Intake 1046.3 ml  Output 350 ml  Net 696.3 ml   Filed Weights   06/25/23 0800 06/26/23 1329  Weight: 81 kg 81 kg     Physical Exam:  General: awake, alert,  NAD Respiratory: normal respiratory effort. Cardiovascular: quick capillary refill, normal S1/S2, RRR, no JVD, murmurs Gastrointestinal: soft, NT, ND Nervous: A&O x3. no gross focal neurologic deficits, normal speech Extremities: moves all equally, no edema, normal tone Skin: dry, intact, normal temperature, normal color. No rashes, lesions or ulcers on exposed skin Psychiatry: normal mood, congruent affect  Labs   I have personally reviewed the following labs and imaging studies CBC    Component Value Date/Time   WBC 7.5 06/26/2023 0404   RBC 4.19 06/26/2023 0404   HGB 13.3 06/26/2023 0404   HCT 37.8 06/26/2023 0404   PLT 143 (L) 06/26/2023 0404   MCV 90.2 06/26/2023 0404   MCH 31.7 06/26/2023 0404   MCHC 35.2 06/26/2023 0404   RDW 12.8 06/26/2023 0404   LYMPHSABS 1.8 12/09/2017 0939   MONOABS 0.4 12/09/2017 0939   EOSABS 0.1 12/09/2017 0939   BASOSABS 0.0 12/09/2017 0939      Latest Ref Rng & Units 06/26/2023    4:04 AM 06/25/2023    6:09 AM 06/24/2023    1:42 PM  BMP  Glucose 70 - 99 mg/dL 161  096  045   BUN 8 - 23 mg/dL 39  46  29   Creatinine 0.44 - 1.00 mg/dL 4.09  8.11  9.14   Sodium 135 - 145 mmol/L 137  139  139   Potassium 3.5 - 5.1 mmol/L 3.2  4.4  4.9   Chloride 98 - 111 mmol/L 108  105  102   CO2 22 - 32 mmol/L 22  22  23    Calcium  8.9 - 10.3 mg/dL 7.8  8.5  78.2     DG Abd 1 View Result Date: 06/25/2023 CLINICAL DATA:  Abdominal pain EXAM: ABDOMEN - 1 VIEW COMPARISON:  CT from the previous day. FINDINGS: Scattered large and small bowel gas is noted. No obstructive changes are seen. Contrast is noted within the bladder related to the recent CT. No free air is seen. No acute bony abnormality noted. IMPRESSION: No acute abnormality noted. Electronically Signed   By: Violeta Grey M.D.   On: 06/25/2023 10:47    Disposition Plan & Communication  Patient status: Inpatient  Admitted From: Home Planned disposition location: Home health Anticipated discharge  date: 4/25 pending finishing clinical workup  Family Communication: sister in law and son at bedside    Author: Ree Candy, DO [facility] 06/27/2023, 7:56 AM   Available by Epic secure chat 7AM-7PM. If 7PM-7AM, please contact night-coverage.  [facility] contact information found on [email or web address].

## 2023-06-27 NOTE — Progress Notes (Signed)
  Jacqueline Sink, MD [facility]   [address], [address] [place], [place] [address] Phone: [phone] Fax : [phone]   Subjective: The patient reports that her abdominal pain is much better today.  The patient had a colonoscopy with ischemic colitis spanning the majority of her seen colon only sparing part of the sigmoid colon which had significant diverticulosis.  Unlike her past colonoscopy it was not difficult to get to the sigmoid colon and the previously reported sigmoid stricture/narrowing was not seen.   Objective: Vital signs in last 24 hours: Vitals:   06/26/23 1648 06/26/23 2019 06/27/23 0415 06/27/23 0750  BP: (!) 160/60 (!) 150/50 (!) 171/60 (!) 151/57  Pulse: 95 95 92 81  Resp: 18 18 18 16   Temp: 98.2 F (36.8 C) 98.4 F (36.9 C) 98.1 F (36.7 C) 98.2 F (36.8 C)  TempSrc: Oral Oral Oral Oral  SpO2: 97% 94% 94% 96%  Weight:      Height:       Weight change: -0.012 kg  Intake/Output Summary (Last 24 hours) at 06/27/2023 1001 Last data filed at 06/27/2023 6962 Gross per 24 hour  Intake 1046.3 ml  Output 350 ml  Net 696.3 ml     Exam: Heart:: Regular rate and rhythm or without murmur or extra heart sounds Lungs: normal and clear to auscultation and percussion Abdomen: soft, nontender, normal bowel sounds   Lab Results: @LABTEST2 @ Micro Results: No results found for this or any previous visit (from the past 240 hours). Studies/Results: No results found. Medications: I have reviewed the patient's current medications. Scheduled Meds:  amLODipine   10 mg Oral Daily   calcium -vitamin D  1 tablet Oral Daily   escitalopram   5 mg Oral Daily   gabapentin   100 mg Oral QHS   mirabegron  ER  25 mg Oral Daily   pantoprazole   40 mg Oral Daily   Continuous Infusions:  ciprofloxacin  400 mg (06/27/23 0617)   lactated ringers  75 mL/hr at 06/27/23 0616   metronidazole  500 mg (06/27/23 0951)   PRN Meds:.acetaminophen , morphine  injection, ondansetron  (ZOFRAN ) IV,  oxyCODONE    Assessment: Principal Problem:   Abdominal pain Active Problems:   Hypertension   Prediabetes   Acute renal failure superimposed on stage 3a chronic kidney disease (HCC)   Leukocytosis   Depression   Abnormal LFTs   Peripheral neuropathy   Hematochezia   Ischemic colitis (HCC)    Plan: This patient was found to have ischemic colitis and states that her abdominal pain is much better today.  The patient is being seen by vascular surgery and general surgery.  There is nothing further to do from a GI point of view.  I will sign off.  Please call if any further GI concerns or questions.  We would like to thank you for the opportunity to participate in the care of Jacqueline Carney.    LOS: 2 days   Jacqueline Sink, MD.FACG 06/27/2023, 10:01 AM Pager [phone] 7am-5pm  Check AMION for 5pm -7am coverage and on weekends

## 2023-06-27 NOTE — TOC Initial Note (Signed)
 Transition of Care [facility]) - Initial/Assessment Note    Patient Details  Name: Jacqueline Carney MRN: [id number] Date of Birth: 1933-04-11  [facility] CM/SW Contact:    Alexandra Ice, RN Phone Number: 06/27/2023, 4:34 PM  Clinical Narrative:                 Met with patient and family at bedside. Explained purpose of visit and role of TOC. She lives alone, independent with ADLs. Discussed PT recommendations. She does not feel like she needs anything for when she discharges. TOC will revisit closer to discharge.   Expected Discharge Plan: Home/Self Care Barriers to Discharge: Continued Medical Work up   Patient Goals and CMS Choice Patient states their goals for this hospitalization and ongoing recovery are:: wants to go home          Expected Discharge Plan and Services       Living arrangements for the past 2 months: Single Family Home                                      Prior Living Arrangements/Services Living arrangements for the past 2 months: Single Family Home Lives with:: Self Patient language and need for interpreter reviewed:: Yes Do you feel safe going back to the place where you live?: Yes      Need for Family Participation in Patient Care: No (Comment) Care giver support system in place?: Yes (comment)   Criminal Activity/Legal Involvement Pertinent to Current Situation/Hospitalization: No - Comment as needed  Activities of Daily Living   ADL Screening (condition at time of admission) Independently performs ADLs?: Yes (appropriate for developmental age) Is the patient deaf or have difficulty hearing?: No Does the patient have difficulty seeing, even when wearing glasses/contacts?: No Does the patient have difficulty concentrating, remembering, or making decisions?: No  Permission Sought/Granted                  Emotional Assessment Appearance:: Appears stated age Attitude/Demeanor/Rapport: Engaged Affect  (typically observed): Appropriate Orientation: : Oriented to Place, Oriented to  Time, Oriented to Situation   Psych Involvement: No (comment)  Admission diagnosis:  Generalized abdominal pain [R10.84] Abdominal pain [R10.9] Nausea and vomiting, unspecified vomiting type [R11.2] Patient Active Problem List   Diagnosis Date Noted   Nausea and vomiting 06/27/2023   Ischemic colitis (HCC) 06/26/2023   Hematochezia 06/25/2023   Abdominal pain 06/24/2023   Acute renal failure superimposed on stage 3a chronic kidney disease (HCC) 06/24/2023   Leukocytosis 06/24/2023   Depression 06/24/2023   Abnormal LFTs 06/24/2023   Peripheral neuropathy 06/24/2023   Stroke-like symptoms 05/21/2023   Sensorineural hearing loss (SNHL) of both ears 05/31/2022   Secondary hyperparathyroidism (HCC) 02/13/2022   Stage 3a chronic kidney disease (HCC) 02/06/2021   Bladder incontinence 08/19/2018   H/O degenerative disc disease 08/19/2018   Hypercholesterolemia 08/19/2018   Hypertension 08/19/2018   Vitamin D deficiency 08/19/2018   Hyperglycemia, unspecified 01/10/2016   Prediabetes 01/10/2016   History of malignant melanoma of skin 07/07/2015   Senile purpura (HCC) 07/07/2015   Palpitations 09/03/2010   PCP:  Melchor Spoon, MD Pharmacy:   [facility] - [place], [place] - [address] [place] [address] Phone: [phone] Fax: [phone]     Social Drivers of Health (SDOH) Social History: SDOH Screenings   Food Insecurity: No Food  Insecurity (06/25/2023)  Housing: Low Risk  (06/25/2023)  Transportation Needs: No Transportation Needs (06/25/2023)  Utilities: Not At Risk (06/25/2023)  Financial Resource Strain: Low Risk  (02/21/2023)   Received from [facility]  Social Connections: Moderately Integrated (06/25/2023)  Tobacco Use: Low Risk  (06/26/2023)  Recent Concern: Tobacco Use - Medium Risk (06/14/2023)   Received from [facility]   SDOH Interventions:     Readmission Risk Interventions     No data to display

## 2023-06-27 NOTE — Progress Notes (Signed)
 Patient ID: Jacqueline Carney, female   DOB: [birth year], 89 y.o.   MRN: [id number]     SURGICAL PROGRESS NOTE   Hospital Day(s): 2.   Interval History: Patient seen and examined, no acute events or new complaints overnight. Patient reports feeling better in the morning.  She endorses that the soreness of the abdominal pain is slowly improving.  She has also The left side of the abdomen but no soreness on the right side of the abdomen.  Interestingly her sigmoid colon is healthy.  She endorses she had a stool last night but cannot recall any blood.  She states that it was just dark brown stool.  Vital signs in last 24 hours: [min-max] current  Temp:  [97.5 F (36.4 C)-98.4 F (36.9 C)] 98.1 F (36.7 C) (04/24 0415) Pulse Rate:  [91-95] 92 (04/24 0415) Resp:  [18] 18 (04/24 0415) BP: (146-171)/(50-60) 171/60 (04/24 0415) SpO2:  [94 %-97 %] 94 % (04/24 0415) Weight:  [81 kg] 81 kg (04/23 1329)     Height: 5' (152.4 cm) Weight: 81 kg BMI (Calculated): 34.88   Physical Exam:  Constitutional: alert, cooperative and no distress  Respiratory: breathing non-labored at rest  Cardiovascular: regular rate and sinus rhythm  Gastrointestinal: soft, mild tender in the left lower quadrant.  No tenderness on the right lower quadrant.   non-distended  Labs:     Latest Ref Rng & Units 06/26/2023    4:04 AM 06/25/2023    6:09 AM 06/24/2023   12:44 PM  CBC  WBC 4.0 - 10.5 K/uL 7.5  12.3  13.9   Hemoglobin 12.0 - 15.0 g/dL 40.9  81.1  91.4   Hematocrit 36.0 - 46.0 % 37.8  45.6  46.1   Platelets 150 - 400 K/uL 143  207  231       Latest Ref Rng & Units 06/26/2023    4:04 AM 06/25/2023    6:09 AM 06/24/2023    1:42 PM  CMP  Glucose 70 - 99 mg/dL 782  956  213   BUN 8 - 23 mg/dL 39  46  29   Creatinine 0.44 - 1.00 mg/dL 0.86  5.78  4.69   Sodium 135 - 145 mmol/L 137  139  139   Potassium 3.5 - 5.1 mmol/L 3.2  4.4  4.9   Chloride 98 - 111 mmol/L 108  105  102   CO2 22 - 32 mmol/L 22  22  23     Calcium  8.9 - 10.3 mg/dL 7.8  8.5  62.9   Total Protein 6.5 - 8.1 g/dL 5.1   7.2   Total Bilirubin 0.0 - 1.2 mg/dL 1.0   2.1   Alkaline Phos 38 - 126 U/L 73   108   AST 15 - 41 U/L 66   74   ALT 0 - 44 U/L 63   42     Imaging studies: No new pertinent imaging studies   Assessment/Plan:  89 y.o. female with ischemic colitis, complicated by pertinent comorbidities including hypertension, hyperlipidemia, GERD, chronic kidney disease stage IIIa.   Ischemic colitis - Diagnosed on colonoscopy today.  Described as severe ischemic changes but no transmural ulcer identified.  Affected colon includes descending, splenic flexure and transverse colon.  Ascending colon not inspected. - Admission CT scan of the abdomen and pelvis without sign of pneumatosis or perforation. - Clinically continue adequate and slowly improvement of her abdominal pain.  Today no pain in the right side of the  abdomen.  Mild soreness on the left lower quadrant. - Vital signs are stable.  He has been no fever and no tachycardia - Will start clear liquid diet today - Will continue with medical management with hydration and antibiotic therapy - Will continue to follow closely with serial physical exams - Will consider repeating CT angio of the abdomen and pelvis depending on how patient continue to progress - Currently no indication for urgent surgical intervention.  Patient high risk to need subtotal colectomy due to the extension of the ischemic changes.  Lucila Rye, MD

## 2023-06-28 ENCOUNTER — Inpatient Hospital Stay

## 2023-06-28 DIAGNOSIS — R1030 Lower abdominal pain, unspecified: Secondary | ICD-10-CM | POA: Diagnosis not present

## 2023-06-28 DIAGNOSIS — I639 Cerebral infarction, unspecified: Secondary | ICD-10-CM | POA: Diagnosis not present

## 2023-06-28 DIAGNOSIS — K559 Vascular disorder of intestine, unspecified: Secondary | ICD-10-CM | POA: Diagnosis not present

## 2023-06-28 LAB — BASIC METABOLIC PANEL WITH GFR
Anion gap: 6 (ref 5–15)
BUN: 13 mg/dL (ref 8–23)
CO2: 23 mmol/L (ref 22–32)
Calcium: 8.2 mg/dL — ABNORMAL LOW (ref 8.9–10.3)
Chloride: 107 mmol/L (ref 98–111)
Creatinine, Ser: 0.81 mg/dL (ref 0.44–1.00)
GFR, Estimated: >60 mL/min (ref >60)
Glucose, Bld: 98 mg/dL (ref 70–99)
Potassium: 3.3 mmol/L — ABNORMAL LOW (ref 3.5–5.1)
Sodium: 136 mmol/L (ref 135–145)

## 2023-06-28 LAB — CBC
HCT: 33.3 % — ABNORMAL LOW (ref 36.0–46.0)
Hemoglobin: 11.7 g/dL — ABNORMAL LOW (ref 12.0–15.0)
MCH: 32 pg (ref 26.0–34.0)
MCHC: 35.1 g/dL (ref 30.0–36.0)
MCV: 91 fL (ref 80.0–100.0)
Platelets: 145 10*3/uL — ABNORMAL LOW (ref 150–400)
RBC: 3.66 MIL/uL — ABNORMAL LOW (ref 3.87–5.11)
RDW: 12.7 % (ref 11.5–15.5)
WBC: 7.5 10*3/uL (ref 4.0–10.5)
nRBC: 0 % (ref 0.0–0.2)

## 2023-06-28 LAB — GLUCOSE, CAPILLARY: Glucose-Capillary: 95 mg/dL (ref 70–99)

## 2023-06-28 LAB — HEMOGLOBIN A1C
Hgb A1c MFr Bld: 5.1 % (ref 4.8–5.6)
Mean Plasma Glucose: 99.67 mg/dL

## 2023-06-28 MED ORDER — STROKE: EARLY STAGES OF RECOVERY BOOK
Freq: Once | Status: AC
  Administered 2023-06-29: 1

## 2023-06-28 MED ORDER — ASPIRIN 81 MG PO TBEC: 81.0000 mg | DELAYED_RELEASE_TABLET | Freq: Every day | ORAL | Status: DC

## 2023-06-28 MED ORDER — IOHEXOL 350 MG/ML SOLN
100.0000 mL | Freq: Once | INTRAVENOUS | Status: AC | PRN
  Administered 2023-06-28: 100 mL via INTRAVENOUS

## 2023-06-28 NOTE — Consult Note (Signed)
 NEUROLOGY CONSULT NOTE   Date of service: June 28, 2023 Patient Name: Jacqueline Carney MRN:  [id number] DOB:  02/16/1934 Chief Complaint: "Altered mental status" Requesting Provider: Ree Candy, MD  History of Present Illness  Jacqueline Carney is a 89 y.o. female with numbness and weakness of her left side that started on Saturday.  She had a transient episode about a month ago, sought care in the emergency department the left before being seen because of a 12-hour long wait.  She then went to [facility] where she was seen and evaluated by neurology, and was felt like symptoms were likely secondary to hypertensive emergency.  She was discharged with treatment for blood pressure prescribed, and her symptoms did improve.  Then on Saturday returned with significant left-sided numbness and weakness.  They have been present since that time.  TNK given: No outside of window  EVT: No, outside window  NIHSS components Score: Comment  1a Level of Conscious 0[x]  1[]  2[]  3[]      1b LOC Questions 0[x]  1[]  2[]       1c LOC Commands 0[x]  1[]  2[]       2 Best Gaze 0[]  1[]  2[]       3 Visual 0[x]  1[]  2[]  3[]      4 Facial Palsy 0[x]  1[]  2[]  3[]      5a Motor Arm - left 0[x]  1[]  2[]  3[]  4[]  UN[]    5b Motor Arm - Right 0[x]  1[]  2[]  3[]  4[]  UN[]    6a Motor Leg - Left 0[]  1[]  2[x]  3[]  4[]  UN[]    6b Motor Leg - Right 0[x]  1[]  2[]  3[]  4[]  UN[]    7 Limb Ataxia 0[]  1[x]  2[]  3[]  UN[]     8 Sensory 0[]  1[x]  2[]  UN[]      9 Best Language 0[x]  1[]  2[]  3[]      10 Dysarthria 0[x]  1[]  2[]  UN[]      11 Extinct. and Inattention 0[x]  1[]  2[]       TOTAL: 4        Past History   Past Medical History:  Diagnosis Date   Bladder incontinence    Cancer (HCC)    skin   GERD (gastroesophageal reflux disease)    H/O degenerative disc disease    Hypercholesterolemia    Hypertension    Palpitations 09/2010   stress echo negative for ischemia, holter without sigmificant abnormality   Sensorineural hearing loss  (SNHL) of both ears    Vascular ectasia of colon    Vitamin D deficiency, unspecified     Past Surgical History:  Procedure Laterality Date   ABDOMINAL HYSTERECTOMY     BACK SURGERY     BROW LIFT Bilateral 01/03/2023   Procedure: BLEPHAROPLASTY UPPER EYELID; W/EXCESS SKIN BILATERAL;  Surgeon: Zacarias Hermann, MD;  Location: [facility];  Service: Ophthalmology;  Laterality: Bilateral;   CATARACT EXTRACTION W/PHACO Left 10/04/2021   Procedure: CATARACT EXTRACTION PHACO AND INTRAOCULAR LENS PLACEMENT (IOC) LEFT 6.09 00:49.9;  Surgeon: Annell Kidney, MD;  Location: [facility];  Service: Ophthalmology;  Laterality: Left;   CATARACT EXTRACTION W/PHACO Right 10/18/2021   Procedure: CATARACT EXTRACTION PHACO AND INTRAOCULAR LENS PLACEMENT (IOC) RIGHT;  Surgeon: Annell Kidney, MD;  Location: [facility];  Service: Ophthalmology;  Laterality: Right;  10.59 1:23.2   CHOLECYSTECTOMY     COLONOSCOPY     with colon polyps   COLONOSCOPY N/A 06/26/2023   Procedure: COLONOSCOPY;  Surgeon: Marnee Sink, MD;  Location: [facility];  Service: Endoscopy;  Laterality: N/A;   COLONOSCOPY  WITH PROPOFOL  N/A 07/26/2017   Procedure: COLONOSCOPY WITH PROPOFOL ;  Surgeon: Cassie Click, MD;  Location: [facility] ENDOSCOPY;  Service: Endoscopy;  Laterality: N/A;    Family History: Family History  Problem Relation Age of Onset   Colon cancer Mother    Breast cancer Neg Hx     Social History  reports that she has never smoked. She has never used smokeless tobacco. She reports that she does not drink alcohol and does not use drugs.  No Known Allergies  Medications   Current Facility-Administered Medications:    acetaminophen  (TYLENOL ) tablet 325 mg, 325 mg, Oral, Q6H PRN, Marnee Sink, MD   amLODipine  (NORVASC ) tablet 10 mg, 10 mg, Oral, Daily, Ole Berkeley, Darren, MD, 10 mg at 06/28/23 1610   calcium -vitamin D (OSCAL WITH D) 500-5 MG-MCG per tablet 1 tablet, 1 tablet, Oral, Daily,  Ole Berkeley, Darren, MD, 1 tablet at 06/28/23 9604   ciprofloxacin  (CIPRO ) IVPB 400 mg, 400 mg, Intravenous, Q12H, Eldred Grego, MD, Last Rate: 200 mL/hr at 06/28/23 1821, 400 mg at 06/28/23 1821   escitalopram  (LEXAPRO ) tablet 5 mg, 5 mg, Oral, Daily, Ole Berkeley, Darren, MD, 5 mg at 06/28/23 5409   gabapentin  (NEURONTIN ) capsule 100 mg, 100 mg, Oral, QHS, Wohl, Darren, MD, 100 mg at 06/27/23 2106   metroNIDAZOLE  (FLAGYL ) IVPB 500 mg, 500 mg, Intravenous, Q12H, Eldred Grego, MD, Last Rate: 100 mL/hr at 06/28/23 0956, 500 mg at 06/28/23 0956   mirabegron  ER (MYRBETRIQ ) tablet 25 mg, 25 mg, Oral, Daily, Ole Berkeley, Darren, MD, 25 mg at 06/28/23 8119   morphine  (PF) 2 MG/ML injection 0.5 mg, 0.5 mg, Intravenous, Q4H PRN, Marnee Sink, MD, 0.5 mg at 06/25/23 1012   ondansetron  (ZOFRAN ) injection 4 mg, 4 mg, Intravenous, Q8H PRN, Marnee Sink, MD, 4 mg at 06/25/23 1736   oxyCODONE  (Oxy IR/ROXICODONE ) immediate release tablet 5 mg, 5 mg, Oral, Q6H PRN, Marnee Sink, MD   pantoprazole  (PROTONIX ) EC tablet 40 mg, 40 mg, Oral, Daily, Ole Berkeley, Darren, MD, 40 mg at 06/28/23 0954  Vitals   Vitals:   06/27/23 1935 06/28/23 0334 06/28/23 0801 06/28/23 1517  BP: (!) 152/48 (!) 169/67 (!) 162/51 (!) 144/54  Pulse: 87 94 80 78  Resp: 16 16 17 17   Temp: 98.7 F (37.1 C) 98 F (36.7 C) 98.1 F (36.7 C) 98.1 F (36.7 C)  TempSrc:   Oral Oral  SpO2: 95% 97% 94% 96%  Weight:      Height:        Body mass index is 34.88 kg/m.  Physical Exam   Constitutional: Appears well-developed and well-nourished.   Neurologic Examination    Neuro: Mental Status: Patient is awake, alert, oriented to person, place, month, year, and situation. Patient is able to give a clear and coherent history. No signs of aphasia or neglect Cranial Nerves: II: Visual Fields are full. Pupils are equal, round, and reactive to light.   III,IV, VI: EOMI without ptosis or diploplia.  V: Facial sensation is symmetric to  temperature VII: Facial movement is symmetric.  VIII: hearing is intact to voice X: Uvula elevates symmetrically XII: tongue is midline without atrophy or fasciculations.  Motor: She has no drift in either upper extremity, but significant weakness in the left lower extremity Sensory: Sensation is diminished in the left hand and throughout the left leg Cerebellar: Finger-nose-finger is uncoordinated on the left hand portion of weakness        Labs/Imaging/Neurodiagnostic studies   CBC:  Recent Labs  Lab 2023/07/02 0404 06/28/23  0203  WBC 7.5 7.5  HGB 13.3 11.7*  HCT 37.8 33.3*  MCV 90.2 91.0  PLT 143* 145*   Basic Metabolic Panel:  Lab Results  Component Value Date   NA 136 06/28/2023   K 3.3 (L) 06/28/2023   CO2 23 06/28/2023   GLUCOSE 98 06/28/2023   BUN 13 06/28/2023   CREATININE 0.81 06/28/2023   CALCIUM  8.2 (L) 06/28/2023   GFRNONAA >60 06/28/2023   GFRAA >60 12/09/2017   Lipid Panel: No results found for: "LDLCALC" HgbA1c: No results found for: "HGBA1C" Urine Drug Screen: No results found for: "LABOPIA", "COCAINSCRNUR", "LABBENZ", "AMPHETMU", "THCU", "LABBARB"  Alcohol Level No results found for: "ETH" INR  Lab Results  Component Value Date   INR 1.0 06/24/2023   APTT  Lab Results  Component Value Date   APTT 31 06/24/2023   AED levels: No results found for: "PHENYTOIN", "ZONISAMIDE", "LAMOTRIGINE", "LEVETIRACETA"  CT Head without contrast(Personally reviewed): CT head with new thalamic stroke    ASSESSMENT   CHRYSTINE FROGGE is a 89 y.o. female small vessel stroke.  She just had some degree of workup at [facility], and therefore I do not think an echocardiogram needs to be repeated.  I would like to get an MRI/MRA.  There has been question of surgery, and therefore I am hesitant to start dual antiplatelet therapy at this time, in case things or changes were needed.  I would favor starting antiplatelet therapy with aspirin once cleared from a  GI/surgical perspective.  RECOMMENDATIONS  Consider aspirin 81 mg daily if felt to be of tolerable risk from GI perspective A1c, lipids MRI/MRA head PT/OT/ST ______________________________________________________________________    Flint Hummer, MD [facility]

## 2023-06-28 NOTE — Care Management Important Message (Signed)
 Important Message  Patient Details  Name: Jacqueline Carney MRN: [id number] Date of Birth: Jan 02, 1934   Important Message Given:  Yes - Medicare IM     Brookie Cantor, CMA 06/28/2023, 10:02 AM

## 2023-06-28 NOTE — Progress Notes (Signed)
 PROGRESS NOTE Jacqueline Carney    DOB: Sep 18, 1933, 89 y.o.  UJW:[id number]    Code Status: Full Code   DOA: 06/24/2023   LOS: 3   Brief hospital course  Jacqueline Carney is a 89 y.o. female with medical history significant of HTN, HLD, prediabetes, GERD, depression, CKD-3A, strokelike symptoms, degenerative disc disease, vascular ectasia of colon, urinary incontinence, skin cancer, colon polyp, who presents with nausea, vomiting, abdominal pain.   CT scan showed large amount of stool and fluid throughout the colon abruptly ending in the distal sigmoid colon where there was a caliber change, concerning for possible stricture or malignancy. Admitted to hospitalist, consulted GI. VS and Hgb ok Colonoscopy 4/23 significant for ischemic bowel. General surgery consulted and recommended bowel rest with conservative management. Vascular did not recommend any intervention.  Advancing diet as tolerated, continuing IV Abx, fluids.  UPDATE: patient found to have subacute right thalamic lacunar infarct. Evaluated by PT/OT with outpatient recs. Neurology consulted.   Assessment & Plan  Principal Problem:   Abdominal pain Active Problems:   Leukocytosis   Hypertension   Prediabetes   Acute renal failure superimposed on stage 3a chronic kidney disease (HCC)   Abnormal LFTs   Peripheral neuropathy   Depression   Hematochezia   Ischemic colitis (HCC)   Nausea and vomiting  Abdominal pain  ischemic bowel- pain resolved. Passing gas. Tolerating CLD CT scan showed large amount of stool and fluid throughout the colon abruptly ending in the distal sigmoid colon where there was a caliber change, concerning for possible stricture or malignancy.  Colonoscopy 4/23 significant for ischemic bowel. General surgery consulted and recommended bowel rest with conservative management. Vascular did not recommend any intervention.  Advancing diet as tolerated, continuing IV Abx, fluids.  Symptom control for  nausea/pain GI following  colonoscopy 04/23 General surgery following CTA abdomen 4/25- colitis without pneumatosis, bowel obstruction, free air CLD today and may advance tomorrow  subacute right thalamic lacunar infarct- seen on head CT. Consistent with described paresthesia on left extremities. PT/OT evaluated and recommended no follow up or outpatient follow up. Anticoagulation plan pending surgical decisions - neurology following, recs pending   Essential Hypertension- poorly controlled. Will need additional agents.  Continue home amlodipine  10mg  daily   Prediabetes- Recent A1c 5.4, well-controlled.  Patient is not taking medications.   AKI superimposed on stage 3a chronic kidney disease- resolving back to baseline Avoid using renal toxic medications  Mild abnormal LFTs- improving. Negative acute hepatitis panel Follow hepatic labs    Depression Lexapro     Class 1 obesity based on BMI: Body mass index is 34.88 kg/m.  VTE ppx: SCDs Start: 06/24/23 2027  Diet:     Diet   Diet clear liquid Fluid consistency: Thin   Consultants: GI Vascular surgery General surgery  Neurology   Subjective 06/28/23    Patient feels well today. She denies any abdominal pain. She has passed gas. No complaints with eating other than does not like the food.    Objective   Vitals:   06/27/23 0750 06/27/23 1556 06/27/23 1935 06/28/23 0334  BP: (!) 151/57 (!) 168/50 (!) 152/48 (!) 169/67  Pulse: 81 82 87 94  Resp: 16 16 16 16   Temp: 98.2 F (36.8 C) 98 F (36.7 C) 98.7 F (37.1 C) 98 F (36.7 C)  TempSrc: Oral     SpO2: 96% 99% 95% 97%  Weight:      Height:        Intake/Output Summary (  Last 24 hours) at 06/28/2023 0716 Last data filed at 06/27/2023 1900 Gross per 24 hour  Intake 622.46 ml  Output --  Net 622.46 ml   Filed Weights   06/25/23 0800 06/26/23 1329  Weight: 81 kg 81 kg    Physical Exam:  General: awake, alert, NAD Respiratory: normal respiratory  effort. Cardiovascular: quick capillary refill, normal S1/S2, RRR, no JVD, murmurs Gastrointestinal: soft, NT, ND Nervous: A&O x3. no gross focal neurologic deficits, normal speech Extremities: moves all equally, no edema, normal tone Skin: dry, intact, normal temperature, normal color. No rashes, lesions or ulcers on exposed skin Psychiatry: normal mood, congruent affect  Labs   I have personally reviewed the following labs and imaging studies CBC    Component Value Date/Time   WBC 7.5 06/28/2023 0203   RBC 3.66 (L) 06/28/2023 0203   HGB 11.7 (L) 06/28/2023 0203   HCT 33.3 (L) 06/28/2023 0203   PLT 145 (L) 06/28/2023 0203   MCV 91.0 06/28/2023 0203   MCH 32.0 06/28/2023 0203   MCHC 35.1 06/28/2023 0203   RDW 12.7 06/28/2023 0203   LYMPHSABS 1.8 12/09/2017 0939   MONOABS 0.4 12/09/2017 0939   EOSABS 0.1 12/09/2017 0939   BASOSABS 0.0 12/09/2017 0939      Latest Ref Rng & Units 06/28/2023    2:03 AM 06/26/2023    4:04 AM 06/25/2023    6:09 AM  BMP  Glucose 70 - 99 mg/dL 98  440  102   BUN 8 - 23 mg/dL 13  39  46   Creatinine 0.44 - 1.00 mg/dL 7.25  3.66  4.40   Sodium 135 - 145 mmol/L 136  137  139   Potassium 3.5 - 5.1 mmol/L 3.3  3.2  4.4   Chloride 98 - 111 mmol/L 107  108  105   CO2 22 - 32 mmol/L 23  22  22    Calcium  8.9 - 10.3 mg/dL 8.2  7.8  8.5     CT HEAD WO CONTRAST ( ) Result Date: 06/27/2023 CLINICAL DATA:  89 year old female with left side neurologic deficit for 4-5 days. EXAM: CT HEAD WITHOUT CONTRAST TECHNIQUE: Contiguous axial images were obtained from the base of the skull through the vertex without intravenous contrast. RADIATION DOSE REDUCTION: This exam was performed according to the departmental dose-optimization program which includes automated exposure control, adjustment of the mA and/or kV according to patient size and/or use of iterative reconstruction technique. COMPARISON:  Head CT 05/20/2023. FINDINGS: Brain: New right thalamic lacunar-type  hypodensity in an area of about 12 mm (series 2, image 14). This tracks toward the posterior limb right internal capsule and is fairly circumscribed. No associated hemorrhage or mass effect. Stable underlying cerebral volume. Gray-white differentiation elsewhere appears stable. No midline shift, ventriculomegaly, mass effect, evidence of mass lesion, intracranial hemorrhage or evidence of cortically based acute infarction. Gray-white matter differentiation is within normal limits throughout the brain. Vascular: Calcified atherosclerosis at the skull base. Stable basal ganglia vascular calcifications. No suspicious intracranial vascular hyperdensity. Skull: Intact.  No acute osseous abnormality identified. Sinuses/Orbits: Visualized paranasal sinuses and mastoids are stable and well aerated. Other: No gaze deviation, acute orbit or scalp soft tissue finding. IMPRESSION: 1. Right thalamic lacunar infarct, new since 05/20/2023 and most likely subacute in this setting. No associated hemorrhage or mass effect. 2. No other acute intracranial abnormality. Electronically Signed   By: Marlise Simpers M.D.   On: 06/27/2023 16:07    Disposition Plan & Communication  Patient status:  Inpatient  Admitted From: Home Planned disposition location: Home health Anticipated discharge date: 4/26 pending finishing clinical workup  Family Communication: sister in law at bedside    Author: Ree Candy, DO [facility] 06/28/2023, 7:16 AM   Available by Epic secure chat 7AM-7PM. If 7PM-7AM, please contact night-coverage.  [facility] contact information found on [email or web address].

## 2023-06-28 NOTE — Plan of Care (Signed)

## 2023-06-28 NOTE — Progress Notes (Signed)
 Patient ID: Jacqueline Carney, female   DOB: 04-15-1933, 89 y.o.   MRN: [id number]     SURGICAL PROGRESS NOTE   Hospital Day(s): 3.   Interval History: Patient seen and examined, no acute events or new complaints overnight. Patient reports feeling much better today.  Patient endorses that there is no other sore point today.  The pain that it was on the left lower quadrant yesterday is resolved.  She is passing gas but no bowel movement yet.  Vital signs in last 24 hours: [min-max] current  Temp:  [98 F (36.7 C)-98.7 F (37.1 C)] 98.1 F (36.7 C) (04/25 0801) Pulse Rate:  [80-94] 80 (04/25 0801) Resp:  [16-17] 17 (04/25 0801) BP: (152-169)/(48-67) 162/51 (04/25 0801) SpO2:  [94 %-99 %] 94 % (04/25 0801)     Height: 5' (152.4 cm) Weight: 81 kg BMI (Calculated): 34.88   Physical Exam:  Constitutional: alert, cooperative and no distress  Respiratory: breathing non-labored at rest  Cardiovascular: regular rate and sinus rhythm  Gastrointestinal: soft, non-tender, and non-distended  Labs:     Latest Ref Rng & Units 06/28/2023    2:03 AM 06/26/2023    4:04 AM 06/25/2023    6:09 AM  CBC  WBC 4.0 - 10.5 K/uL 7.5  7.5  12.3   Hemoglobin 12.0 - 15.0 g/dL 40.9  81.1  91.4   Hematocrit 36.0 - 46.0 % 33.3  37.8  45.6   Platelets 150 - 400 K/uL 145  143  207       Latest Ref Rng & Units 06/28/2023    2:03 AM 06/26/2023    4:04 AM 06/25/2023    6:09 AM  CMP  Glucose 70 - 99 mg/dL 98  782  956   BUN 8 - 23 mg/dL 13  39  46   Creatinine 0.44 - 1.00 mg/dL 2.13  0.86  5.78   Sodium 135 - 145 mmol/L 136  137  139   Potassium 3.5 - 5.1 mmol/L 3.3  3.2  4.4   Chloride 98 - 111 mmol/L 107  108  105   CO2 22 - 32 mmol/L 23  22  22    Calcium  8.9 - 10.3 mg/dL 8.2  7.8  8.5   Total Protein 6.5 - 8.1 g/dL  5.1    Total Bilirubin 0.0 - 1.2 mg/dL  1.0    Alkaline Phos 38 - 126 U/L  73    AST 15 - 41 U/L  66    ALT 0 - 44 U/L  63      Imaging studies: CT angio of the abdomen and pelvis shows  suspected colitis of the transverse and descending colon.  No pneumatosis, no portal vein gas.   Assessment/Plan:  89 y.o. female with ischemic colitis, complicated by pertinent comorbidities including hypertension, hyperlipidemia, GERD, chronic kidney disease stage IIIa.   Ischemic colitis - Diagnosed on colonoscopy today.  Described as severe ischemic changes but no transmural ulcer identified.  Affected colon includes descending, splenic flexure and transverse colon.  Ascending colon not inspected. - Admission CT scan of the abdomen and pelvis without sign of pneumatosis or perforation. - Repeated CTA of the abdomen and pelvis today shows suspected colitis without progression.  There is no pneumatosis, no portal vein gas. - Clinically the patient pain is improving.  Actually the pain resolved today.  None tenderness to palpation in the left lower quadrant as she had yesterday - Will continue slow progression of conservative management. - Will  keep her with clear liquid diet for today and possible advance tomorrow if pain continue to be controlled - Continue IV antibiotic therapy for ischemic colitis to avoid translocation bacteria - Continue IV fluid - Encourage patient to ambulate - As the patient is slowly getting better, will hope to avoid any surgical intervention.  Will continue to follow closely.  Lucila Rye, MD

## 2023-06-28 NOTE — TOC Progression Note (Signed)
 Transition of Care [facility]) - Progression Note    Patient Details  Name: LAELAH SIRAVO MRN: [id number] Date of Birth: [birth year]  Transition of Care [facility]) CM/SW Contact  Alexandra Ice, RN Phone Number: 06/28/2023, 1:31 PM  Clinical Narrative:    Met with patient and family at bedside, discussed discharge plan and need for RW. She is agreeable to RW, she stated she does not want to fall again. She stated PT now recommended outpatient PT. She has no preference of DME agency. Sent RW order to Jon with [employer] for processing.    Expected Discharge Plan: Home/Self Care Barriers to Discharge: Continued Medical Work up  Expected Discharge Plan and Services       Living arrangements for the past 2 months: Single Family Home                                       Social Determinants of Health (SDOH) Interventions SDOH Screenings   Food Insecurity: No Food Insecurity (06/25/2023)  Housing: Low Risk  (06/25/2023)  Transportation Needs: No Transportation Needs (06/25/2023)  Utilities: Not At Risk (06/25/2023)  Financial Resource Strain: Low Risk  (02/21/2023)   Received from [facility]  Social Connections: Moderately Integrated (06/25/2023)  Tobacco Use: Low Risk  (06/26/2023)  Recent Concern: Tobacco Use - Medium Risk (06/14/2023)   Received from [facility]    Readmission Risk Interventions     No data to display

## 2023-06-28 NOTE — Progress Notes (Signed)
 Physical Therapy Treatment Patient Details Name: Jacqueline Carney MRN: [id number] DOB: 01-Jun-1933 Today's Date: 06/28/2023   History of Present Illness Jacqueline Carney is a 89 y.o. female with medical history significant of HTN, HLD, prediabetes, GERD, depression, CKD-3A, strokelike symptoms, degenerative disc disease, vascular ectasia of colon, urinary incontinence, skin cancer, colon polyp, who presents with nausea, vomiting, abdominal pain.    PT Comments  Pt received upright in bed agreeable to PT. Pt and DIL aware she had a CVA leading to L sided weakness. Pt with vastly improved mobility this date. Mod-I with bed mobility and supervision for STS and gait 250'. Educated pt and DIL on asc/desc stairs safely with good understanding and excellent completion with rails and step to pattern. Returned to room in recliner with all needs in reach. Updated d/c recs due to acute CVA and L sided weakness. Pt and DIL in agreement with POC.    If plan is discharge home, recommend the following: A little help with walking and/or transfers;Assistance with cooking/housework;Help with stairs or ramp for entrance;A little help with bathing/dressing/bathroom   Can travel by private vehicle        Equipment Recommendations  Rolling walker (2 wheels)    Recommendations for Other Services       Precautions / Restrictions Precautions Recall of Precautions/Restrictions: Intact Restrictions Weight Bearing Restrictions Per Provider Order: No     Mobility  Bed Mobility Overal bed mobility: Modified Independent               Patient Response: Cooperative  Transfers Overall transfer level: Needs assistance Equipment used: Rolling walker (2 wheels) Transfers: Sit to/from Stand Sit to Stand: Supervision                Ambulation/Gait Ambulation/Gait assistance: Supervision Gait Distance (Feet): 240 Feet Assistive device: Rolling walker (2 wheels) Gait Pattern/deviations:  Step-through pattern       General Gait Details: completes excellent gait distances and quality using RW. Chair follow provided for safety/fatigue.   Stairs Stairs: Yes Stairs assistance: Supervision Stair Management: One rail Right, One rail Left, Step to pattern, Forwards Number of Stairs: 4 General stair comments: DIL present. Discussed RW mgmt for entering/exiting bed   Wheelchair Mobility     Tilt Bed Tilt Bed Patient Response: Cooperative  Modified Rankin (Stroke Patients Only)       Balance Overall balance assessment: Needs assistance Sitting-balance support: No upper extremity supported, Feet supported Sitting balance-Leahy Scale: Good     Standing balance support: No upper extremity supported, During functional activity Standing balance-Leahy Scale: Fair                              Hotel manager: No apparent difficulties  Cognition Arousal: Alert Behavior During Therapy: WFL for tasks assessed/performed   PT - Cognitive impairments: No apparent impairments                         Following commands: Intact      Cueing Cueing Techniques: Verbal cues  Exercises      General Comments        Pertinent Vitals/Pain Pain Assessment Pain Assessment: No/denies pain    Home Living Family/patient expects to be discharged to:: Private residence Living Arrangements: Alone Available Help at Discharge: Family;Available PRN/intermittently Type of Home: House Home Access: Stairs to enter Entrance Stairs-Rails: Right;Left Entrance Stairs-Number of Steps: 3   Home  Layout: One level Home Equipment: None      Prior Function            PT Goals (current goals can now be found in the care plan section) Acute Rehab PT Goals Patient Stated Goal: return home and improve strength PT Goal Formulation: With patient Time For Goal Achievement: 07/11/23 Potential to Achieve Goals: Good Progress towards PT  goals: Progressing toward goals    Frequency    Min 3X/week      PT Plan      Co-evaluation              AM-PAC PT "6 Clicks" Mobility   Outcome Measure  Help needed turning from your back to your side while in a flat bed without using bedrails?: A Little Help needed moving from lying on your back to sitting on the side of a flat bed without using bedrails?: A Little Help needed moving to and from a bed to a chair (including a wheelchair)?: A Little Help needed standing up from a chair using your arms (e.g., wheelchair or bedside chair)?: A Little Help needed to walk in hospital room?: A Little Help needed climbing 3-5 steps with a railing? : A Little 6 Click Score: 18    End of Session Equipment Utilized During Treatment: Gait belt Activity Tolerance: Patient tolerated treatment well Patient left: in chair;with call bell/phone within reach;with chair alarm set;with family/visitor present Nurse Communication: Mobility status PT Visit Diagnosis: Other abnormalities of gait and mobility (R26.89);Difficulty in walking, not elsewhere classified (R26.2);Muscle weakness (generalized) (M62.81)     Time: 1610-9604 PT Time Calculation (min) (ACUTE ONLY): 25 min  Charges:    $Gait Training: 23-37 mins PT General Charges $$ ACUTE PT VISIT: 1 Visit                    Marc Senior. Fairly IV, PT, DPT Physical Therapist- [place]  [facility]  06/28/2023, 12:58 PM

## 2023-06-28 NOTE — Evaluation (Signed)
 Occupational Therapy Evaluation Patient Details Name: Jacqueline Carney MRN: [id number] DOB: [birth year] Today's Date: 06/28/2023   History of Present Illness   Jacqueline Carney is a 89 y.o. female with medical history significant of HTN, HLD, prediabetes, GERD, depression, CKD-3A, strokelike symptoms, degenerative disc disease, vascular ectasia of colon, urinary incontinence, skin cancer, colon polyp, who presents with nausea, vomiting, abdominal pain.    Clinical Impressions Pt was seen for OT evaluation this date. Prior to hospital admission, pt was independent, living alone, and reports walking 2 miles each morning then doing yard work prior to starting her day. Pt presents to acute OT demonstrating impaired ADL performance and functional mobility 2/2 mild LUE FMC/strength, LLE strength/FMC, mild sensory deficits, and impaired balance (See OT problem list for additional functional deficits). Pt currently requires CGA in standing for clothing mgt/LB dressing, CGA for toilet transfers, and CGA for ADL mobility with RW.  Pt would benefit from skilled OT services to address noted impairments and functional limitations (see below for any additional details) in order to maximize safety and independence while minimizing falls risk and caregiver burden.    If plan is discharge home, recommend the following:   A little help with walking and/or transfers;A little help with bathing/dressing/bathroom;Assistance with cooking/housework;Assist for transportation;Help with stairs or ramp for entrance     Functional Status Assessment   Patient has had a recent decline in their functional status and demonstrates the ability to make significant improvements in function in a reasonable and predictable amount of time.     Equipment Recommendations  None recommended by OT    Precautions/Restrictions   Precautions Precautions: Fall Recall of Precautions/Restrictions: Intact Restrictions Weight  Bearing Restrictions Per Provider Order: No     Mobility Bed Mobility Overal bed mobility: Modified Independent    Transfers Overall transfer level: Needs assistance Equipment used: Rolling walker (2 wheels) Transfers: Sit to/from Stand Sit to Stand: Supervision, Contact guard assist      Balance Overall balance assessment: Needs assistance Sitting-balance support: No upper extremity supported, Feet supported Sitting balance-Leahy Scale: Good     Standing balance support: No upper extremity supported, During functional activity Standing balance-Leahy Scale: Fair Standing balance comment: fair while completing clothing mgt       ADL either performed or assessed with clinical judgement   ADL Overall ADL's : Needs assistance/impaired     Grooming: Standing;Wash/dry hands;Supervision/safety     Lower Body Dressing: Sit to/from stand;Contact guard assist   Toilet Transfer: Contact guard assist;BSC/3in1;Rolling walker (2 wheels);Regular Teacher, adult education Details (indicate cue type and reason): BSC over toilet Toileting- Clothing Manipulation and Hygiene: Modified independent;Contact guard assist Toileting - Clothing Manipulation Details (indicate cue type and reason): CGA for completing clothing mgt in standing     Functional mobility during ADLs: Contact guard assist;Rolling walker (2 wheels)        Pertinent Vitals/Pain Pain Assessment Pain Assessment: No/denies pain     Extremity/Trunk Assessment Upper Extremity Assessment Upper Extremity Assessment: Right hand dominant;LUE deficits/detail LUE Deficits / Details: grossly 4+/5 versus RUE 5/5; very mild FMC deficits but functionally not significantly impaired LUE Sensation: WNL LUE Coordination: decreased fine motor   Lower Extremity Assessment Lower Extremity Assessment: LLE deficits/detail;Defer to PT evaluation LLE Sensation: decreased light touch LLE Coordination: decreased fine motor;decreased gross  motor       Communication Communication Communication: No apparent difficulties   Cognition Arousal: Alert Behavior During Therapy: WFL for tasks assessed/performed Cognition: No apparent impairments  Following commands: Intact       Cueing  General Comments   Cueing Techniques: Verbal cues      Exercises Other Exercises Other Exercises: Pt/family educated in role of acute OT, falls prevention        Home Living Family/patient expects to be discharged to:: Private residence Living Arrangements: Alone Available Help at Discharge: Family;Available PRN/intermittently Type of Home: House Home Access: Stairs to enter [employer] of Steps: 3 Entrance Stairs-Rails: Right;Left Home Layout: One level     Bathroom Shower/Tub: Chief Strategy Officer: Handicapped height Bathroom Accessibility: Yes   Home Equipment: None          Prior Functioning/Environment Prior Level of Function : Independent/Modified Independent      ADLs Comments: son checks in daily on pt but no support needed    OT Problem List: Decreased strength;Decreased coordination;Impaired balance (sitting and/or standing);Decreased knowledge of use of DME or AE   OT Treatment/Interventions: Self-care/ADL training;Therapeutic exercise;Therapeutic activities;Neuromuscular education;DME and/or AE instruction;Patient/family education;Balance training      OT Goals(Current goals can be found in the care plan section)   Acute Rehab OT Goals Patient Stated Goal: go home OT Goal Formulation: With patient/family Time For Goal Achievement: 07/12/23 Potential to Achieve Goals: Good ADL Goals Pt Will Perform Upper Body Dressing: with modified independence Pt Will Perform Lower Body Dressing: with modified independence Additional ADL Goal #1: Pt will complete all aspects of bathing, in sitting vs standing, with modified indep, 1/1 opportunity.   OT Frequency:  Min 2X/week        AM-PAC OT "6 Clicks" Daily Activity     Outcome Measure Help from another person eating meals?: None Help from another person taking care of personal grooming?: None Help from another person toileting, which includes using toliet, bedpan, or urinal?: A Little Help from another person bathing (including washing, rinsing, drying)?: A Little Help from another person to put on and taking off regular upper body clothing?: None Help from another person to put on and taking off regular lower body clothing?: A Little 6 Click Score: 21   End of Session Equipment Utilized During Treatment: Rolling walker (2 wheels);Gait belt  Activity Tolerance: Patient tolerated treatment well Patient left: in bed;with call bell/phone within reach;with family/visitor present;Other (comment) (transport staff)  OT Visit Diagnosis: Other abnormalities of gait and mobility (R26.89)                Time: 1610-9604 OT Time Calculation (min): 21 min Charges:  OT General Charges $OT Visit: 1 Visit OT Evaluation $OT Eval Low Complexity: 1 Low OT Treatments $Self Care/Home Management : 8-22 mins  Berenda Breaker., MPH, MS, OTR/L ascom [phone] 06/28/23, 10:34 AM

## 2023-06-29 DIAGNOSIS — R1030 Lower abdominal pain, unspecified: Secondary | ICD-10-CM | POA: Diagnosis not present

## 2023-06-29 DIAGNOSIS — I6381 Other cerebral infarction due to occlusion or stenosis of small artery: Secondary | ICD-10-CM

## 2023-06-29 DIAGNOSIS — I639 Cerebral infarction, unspecified: Secondary | ICD-10-CM | POA: Diagnosis not present

## 2023-06-29 DIAGNOSIS — K559 Vascular disorder of intestine, unspecified: Secondary | ICD-10-CM | POA: Diagnosis not present

## 2023-06-29 LAB — GLUCOSE, CAPILLARY: Glucose-Capillary: 111 mg/dL — ABNORMAL HIGH (ref 70–99)

## 2023-06-29 LAB — LIPID PANEL
Cholesterol: 97 mg/dL (ref 0–200)
HDL: 24 mg/dL — ABNORMAL LOW (ref >40)
LDL Cholesterol: 57 mg/dL (ref 0–99)
Total CHOL/HDL Ratio: 4 ratio
Triglycerides: 82 mg/dL (ref <150)
VLDL: 16 mg/dL (ref 0–40)

## 2023-06-29 MED ORDER — ASPIRIN 81 MG PO TBEC
81.0000 mg | DELAYED_RELEASE_TABLET | Freq: Every day | ORAL | Status: DC
  Administered 2023-06-29 – 2023-07-01 (×3): 81 mg via ORAL
  Filled 2023-06-29 (×3): qty 1

## 2023-06-29 NOTE — Progress Notes (Signed)
 SLP Cancellation Note  Patient Details Name: Jacqueline Carney MRN: [id number] DOB: October 08, 1933   Cancelled treatment:       Reason Eval/Treat Not Completed: SLP screened, no needs identified, will sign off. Orders received for speech language eval. MRI 06/28/22: 1 cm acute to early subacute ischemic nonhemorrhagic right thalamocapsular infarct. Underlying age-related cerebral atrophy with mild chronic microvascular ischemic disease. 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 Pt seen for cognitive screen- pt and family (son and daughter in law) denied cognitive communication concerns. No acute SLP services indicated.   [place] Zabian Swayne Clapp, MS, CCC-SLP Speech Language Pathologist Rehab Services; [facility] [phone] (ascom)   [place] J Clapp 06/29/2023, 11:06 AM

## 2023-06-29 NOTE — Progress Notes (Signed)
 Patient ID: Jacqueline Carney, female   DOB: [birth year], 89 y.o.   MRN: [id number]     SURGICAL PROGRESS NOTE   Hospital Day(s): 4.   Interval History: Patient seen and examined, no acute events or new complaints overnight. Patient reports patient reported continued without soreness of the abdomen.  No abdominal pain.  She has been tolerated clear liquid diet without pain.  She is still passing gas.  She denies any nausea or vomiting.  No radiation.  No alleviating or aggravating factors.  White blood cell count yesterday still within normal limits.  There have been no fever.  Vital signs in last 24 hours: [min-max] current  Temp:  [98.1 F (36.7 C)-99.7 F (37.6 C)] 98.1 F (36.7 C) (04/26 0819) Pulse Rate:  [75-88] 75 (04/26 0819) Resp:  [14-17] 16 (04/26 0819) BP: (144-157)/(50-63) 150/50 (04/26 0819) SpO2:  [94 %-98 %] 96 % (04/26 0819)     Height: 5' (152.4 cm) Weight: 81 kg BMI (Calculated): 34.88   Physical Exam:  Constitutional: alert, cooperative and no distress  Respiratory: breathing non-labored at rest  Cardiovascular: regular rate and sinus rhythm  Gastrointestinal: soft, non-tender, and non-distended  Labs:     Latest Ref Rng & Units 06/28/2023    2:03 AM 06/26/2023    4:04 AM 06/25/2023    6:09 AM  CBC  WBC 4.0 - 10.5 K/uL 7.5  7.5  12.3   Hemoglobin 12.0 - 15.0 g/dL 40.9  81.1  91.4   Hematocrit 36.0 - 46.0 % 33.3  37.8  45.6   Platelets 150 - 400 K/uL 145  143  207       Latest Ref Rng & Units 06/28/2023    2:03 AM 06/26/2023    4:04 AM 06/25/2023    6:09 AM  CMP  Glucose 70 - 99 mg/dL 98  782  956   BUN 8 - 23 mg/dL 13  39  46   Creatinine 0.44 - 1.00 mg/dL 2.13  0.86  5.78   Sodium 135 - 145 mmol/L 136  137  139   Potassium 3.5 - 5.1 mmol/L 3.3  3.2  4.4   Chloride 98 - 111 mmol/L 107  108  105   CO2 22 - 32 mmol/L 23  22  22    Calcium  8.9 - 10.3 mg/dL 8.2  7.8  8.5   Total Protein 6.5 - 8.1 g/dL  5.1    Total Bilirubin 0.0 - 1.2 mg/dL  1.0    Alkaline  Phos 38 - 126 U/L  73    AST 15 - 41 U/L  66    ALT 0 - 44 U/L  63      Imaging studies: No new pertinent imaging studies   Assessment/Plan:  89 y.o. female with ischemic colitis, complicated by pertinent comorbidities including hypertension, hyperlipidemia, GERD, chronic kidney disease stage IIIa.   Ischemic colitis - Diagnosed on colonoscopy today.  Described as severe ischemic changes but no transmural ulcer identified.  Affected colon includes descending, splenic flexure and transverse colon.  Ascending colon not inspected. - Admission CT scan of the abdomen and pelvis without sign of pneumatosis or perforation. - Repeated CTA of the abdomen and pelvis yesterday shows suspected colitis without progression.  There is no pneumatosis, no portal vein gas. - Patient continue without clinical toleration.  Has been no fever.  No abdominal pain.  Patient tolerated clear liquid diet without pain.  Continue passing gas - Will advance diet to full liquids -  Encouraged the patient to ambulate - Continue IV antibiotic therapy - Will continue to follow closely clinically with vital signs, physical exam.  Lucila Rye, MD

## 2023-06-29 NOTE — TOC Progression Note (Signed)
 Transition of Care [facility]) - Progression Note    Patient Details  Name: Jacqueline Carney MRN: [id number] Date of Birth: 1934/01/22  [facility]) CM/SW Contact  Alexandra Ice, RN Phone Number: 06/29/2023, 3:04 PM  Clinical Narrative:     Patient wants to go to [facility] for her outpatient therapy.   Expected Discharge Plan: Home/Self Care Barriers to Discharge: Continued Medical Work up  Expected Discharge Plan and Services       Living arrangements for the past 2 months: Single Family Home                                       Social Determinants of Health (SDOH) Interventions SDOH Screenings   Food Insecurity: No Food Insecurity (06/25/2023)  Housing: Low Risk  (06/25/2023)  Transportation Needs: No Transportation Needs (06/25/2023)  Utilities: Not At Risk (06/25/2023)  Financial Resource Strain: Low Risk  (02/21/2023)   Received from [facility]  Social Connections: Moderately Integrated (06/25/2023)  Tobacco Use: Low Risk  (06/26/2023)  Recent Concern: Tobacco Use - Medium Risk (06/14/2023)   Received from [facility]    Readmission Risk Interventions     No data to display

## 2023-06-29 NOTE — Progress Notes (Addendum)
 PROGRESS NOTE Jacqueline Carney    DOB: 1933-07-24, 89 y.o.  WUJ:[id number]    Code Status: Full Code   DOA: 06/24/2023   LOS: 4  Brief hospital course  Jacqueline Carney is a 89 y.o. female with medical history significant of HTN, HLD, prediabetes, GERD, depression, CKD-3A, stroke-like symptoms, degenerative disc disease, vascular ectasia of colon, urinary incontinence, skin cancer, colon polyp, who presents with nausea, vomiting, abdominal pain.   CT scan showed large amount of stool and fluid throughout the colon abruptly ending in the distal sigmoid colon where there was a caliber change, concerning for possible stricture or malignancy. Admitted to hospitalist, consulted GI. VS and Hgb ok Colonoscopy 4/23 significant for ischemic bowel. General surgery consulted and recommended bowel rest with conservative management. Vascular did not recommend any intervention.  Advancing diet as tolerated, continuing IV Abx, fluids.  UPDATE: patient found to have subacute right thalamic lacunar infarct. Evaluated by PT/OT with outpatient recs. Neurology consulted.   Assessment & Plan  Principal Problem:   Abdominal pain Active Problems:   Leukocytosis   Hypertension   Prediabetes   Acute renal failure superimposed on stage 3a chronic kidney disease (HCC)   Abnormal LFTs   Peripheral neuropathy   Depression   Hematochezia   Ischemic colitis (HCC)   Nausea and vomiting  Abdominal pain  ischemic bowel- pain resolved. Passing gas and dark, dried blood clots, per patient. Tolerating full liquid diet. CT scan showed large amount of stool and fluid throughout the colon abruptly ending in the distal sigmoid colon where there was a caliber change, concerning for possible stricture or malignancy.  Colonoscopy 4/23 significant for ischemic bowel. General surgery consulted and recommended bowel rest with conservative management. Vascular did not recommend any intervention.  Advancing diet as tolerated,  continuing IV Abx, fluids.  Symptom control for nausea/pain GI consulted colonoscopy 04/23 General surgery following CTA abdomen 4/25- colitis without pneumatosis, bowel obstruction, free air Full liquids today Follow for signs of bleeding in BM now that starting ASA  Acute/subacute right thalamic lacunar infarct- seen on head CT. Confirmed with MRI. No LVO. Consistent with described paresthesia on left extremities. PT/OT evaluated and recommended no follow up or outpatient follow up.  - neurology following - starting aspiring today, close monitoring for GI bleed - CBC am  Mild hypokalemia- monitor and replete PRN   Essential Hypertension- poorly controlled. Will need additional agents.  Continue home amlodipine  10mg  daily   Prediabetes- Recent A1c 5.4, well-controlled.  Patient is not taking medications.   AKI superimposed on stage 3a CKD- resolved back to baseline Avoid using renal toxic medications  Mild abnormal LFTs- improving. Negative acute hepatitis panel Follow hepatic labs    Depression Lexapro     Class 1 obesity based on BMI: Body mass index is 34.88 kg/m.  VTE ppx: SCDs Start: 06/24/23 2027  Diet:     Diet   Diet clear liquid Fluid consistency: Thin   Consultants: GI Vascular surgery General surgery  Neurology   Subjective 06/29/23    Patient feels well today. She passed dark black blood clot and passing a lot of gas. No formed BM. Tolerating full liquid diet this am without any nausea or abdominal pain    Objective   Vitals:   06/28/23 0801 06/28/23 1517 06/28/23 2106 06/29/23 0443  BP: (!) 162/51 (!) 144/54 (!) 146/56 (!) 157/63  Pulse: 80 78 79 88  Resp: 17 17 15 14   Temp: 98.1 F (36.7 C) 98.1 F (36.7  C) 98.4 F (36.9 C) 99.7 F (37.6 C)  TempSrc: Oral Oral  Oral  SpO2: 94% 96% 98% 94%  Weight:      Height:        Intake/Output Summary (Last 24 hours) at 06/29/2023 0800 Last data filed at 06/28/2023 2100 Gross per 24 hour  Intake  1471.39 ml  Output --  Net 1471.39 ml   Filed Weights   06/25/23 0800 06/26/23 1329  Weight: 81 kg 81 kg    Physical Exam:  General: awake, alert, NAD Respiratory: normal respiratory effort. Cardiovascular: quick capillary refill, normal S1/S2, RRR, no JVD, murmurs Gastrointestinal: soft, NT, ND Nervous: A&O x3. no gross focal neurologic deficits, normal speech Extremities: moves all equally, no edema, normal tone Skin: dry, intact, normal temperature, normal color. No rashes, lesions or ulcers on exposed skin Psychiatry: normal mood, congruent affect  Labs   I have personally reviewed the following labs and imaging studies CBC    Component Value Date/Time   WBC 7.5 06/28/2023 0203   RBC 3.66 (L) 06/28/2023 0203   HGB 11.7 (L) 06/28/2023 0203   HCT 33.3 (L) 06/28/2023 0203   PLT 145 (L) 06/28/2023 0203   MCV 91.0 06/28/2023 0203   MCH 32.0 06/28/2023 0203   MCHC 35.1 06/28/2023 0203   RDW 12.7 06/28/2023 0203   LYMPHSABS 1.8 12/09/2017 0939   MONOABS 0.4 12/09/2017 0939   EOSABS 0.1 12/09/2017 0939   BASOSABS 0.0 12/09/2017 0939      Latest Ref Rng & Units 06/28/2023    2:03 AM 06/26/2023    4:04 AM 06/25/2023    6:09 AM  BMP  Glucose 70 - 99 mg/dL 98  478  295   BUN 8 - 23 mg/dL 13  39  46   Creatinine 0.44 - 1.00 mg/dL 6.21  3.08  6.57   Sodium 135 - 145 mmol/L 136  137  139   Potassium 3.5 - 5.1 mmol/L 3.3  3.2  4.4   Chloride 98 - 111 mmol/L 107  108  105   CO2 22 - 32 mmol/L 23  22  22    Calcium  8.9 - 10.3 mg/dL 8.2  7.8  8.5     MR BRAIN WO CONTRAST Result Date: 06/28/2023 CLINICAL DATA:  Follow-up examination for stroke. EXAM: MRI HEAD WITHOUT CONTRAST MRA HEAD WITHOUT CONTRAST TECHNIQUE: Multiplanar, multi-echo pulse sequences of the brain and surrounding structures were acquired without intravenous contrast. Angiographic images of the Circle of Willis were acquired using MRA technique without intravenous contrast. COMPARISON:  CT from 06/27/2023. FINDINGS:  MRI HEAD FINDINGS Brain: Generalized age-related cerebral atrophy. Patchy T2/FLAIR hyperintensity involving the periventricular deep white matter both cerebral hemispheres, consistent with chronic small vessel ischemic disease, mild for age. 1 cm focus of restricted diffusion involving the right thalamocapsular region, consistent with an acute to early subacute ischemic infarct. No associated hemorrhage or mass effect. No other evidence for acute or subacute ischemia. Gray-white matter differentiation otherwise maintained. No acute intracranial hemorrhage. 4 mm lesion with T2 hypointense rim and blooming artifact on SWI sequence present at the right aspect of the splenium, consistent with a small cavernoma. No other mass lesion, mass effect or midline shift. No hydrocephalus or extra-axial fluid collection. Pituitary gland mildly prominent with convex border superiorly but no discrete lesion. Vascular: Major intracranial vascular flow voids are maintained. Skull and upper cervical spine: Craniocervical junction within normal limits. Bone marrow signal intensity normal. No scalp soft tissue abnormality. Sinuses/Orbits: Prior bilateral ocular lens  replacement. Mucosal thickening present about the left frontoethmoidal recess. Paranasal sinuses are otherwise largely clear. Trace bilateral mastoid effusions, of doubtful significance. Other: None. MRA HEAD FINDINGS Anterior circulation: Both internal carotid arteries are widely patent to the siphons without stenosis or other abnormality. A1 segments patent bilaterally. Normal anterior communicating artery complex. Anterior cerebral arteries patent without significant stenosis. No M1 stenosis or occlusion. Distal MCA branches perfused and symmetric. Posterior circulation: Both V4 segments patent without significant stenosis. Neither PICA origin well visualized. Basilar patent without stenosis. Superior cerebellar arteries patent bilaterally. Both PCAs primarily supplied  via the basilar. PCAs are patent to their distal aspects without hemodynamically significant stenosis. Anatomic variants: None significant.  No intracranial aneurysm. IMPRESSION: MRI HEAD: 1. 1 cm acute to early subacute ischemic nonhemorrhagic right thalamocapsular infarct. 2. Underlying age-related cerebral atrophy with mild chronic microvascular ischemic disease. 3. 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 MRA HEAD: Normal intracranial MRA. Electronically Signed   By: Virgia Griffins M.D.   On: 06/28/2023 22:33   MR ANGIO HEAD WO CONTRAST Result Date: 06/28/2023 CLINICAL DATA:  Follow-up examination for stroke. EXAM: MRI HEAD WITHOUT CONTRAST MRA HEAD WITHOUT CONTRAST TECHNIQUE: Multiplanar, multi-echo pulse sequences of the brain and surrounding structures were acquired without intravenous contrast. Angiographic images of the Circle of Willis were acquired using MRA technique without intravenous contrast. COMPARISON:  CT from 06/27/2023. FINDINGS: MRI HEAD FINDINGS Brain: Generalized age-related cerebral atrophy. Patchy T2/FLAIR hyperintensity involving the periventricular deep white matter both cerebral hemispheres, consistent with chronic small vessel ischemic disease, mild for age. 1 cm focus of restricted diffusion involving the right thalamocapsular region, consistent with an acute to early subacute ischemic infarct. No associated hemorrhage or mass effect. No other evidence for acute or subacute ischemia. Gray-white matter differentiation otherwise maintained. No acute intracranial hemorrhage. 4 mm lesion with T2 hypointense rim and blooming artifact on SWI sequence present at the right aspect of the splenium, consistent with a small cavernoma. No other mass lesion, mass effect or midline shift. No hydrocephalus or extra-axial fluid collection. Pituitary gland mildly prominent with convex border superiorly but no discrete lesion. Vascular: Major intracranial vascular flow voids are  maintained. Skull and upper cervical spine: Craniocervical junction within normal limits. Bone marrow signal intensity normal. No scalp soft tissue abnormality. Sinuses/Orbits: Prior bilateral ocular lens replacement. Mucosal thickening present about the left frontoethmoidal recess. Paranasal sinuses are otherwise largely clear. Trace bilateral mastoid effusions, of doubtful significance. Other: None. MRA HEAD FINDINGS Anterior circulation: Both internal carotid arteries are widely patent to the siphons without stenosis or other abnormality. A1 segments patent bilaterally. Normal anterior communicating artery complex. Anterior cerebral arteries patent without significant stenosis. No M1 stenosis or occlusion. Distal MCA branches perfused and symmetric. Posterior circulation: Both V4 segments patent without significant stenosis. Neither PICA origin well visualized. Basilar patent without stenosis. Superior cerebellar arteries patent bilaterally. Both PCAs primarily supplied via the basilar. PCAs are patent to their distal aspects without hemodynamically significant stenosis. Anatomic variants: None significant.  No intracranial aneurysm. IMPRESSION: MRI HEAD: 1. 1 cm acute to early subacute ischemic nonhemorrhagic right thalamocapsular infarct. 2. Underlying age-related cerebral atrophy with mild chronic microvascular ischemic disease. 3. 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 MRA HEAD: Normal intracranial MRA. Electronically Signed   By: Virgia Griffins M.D.   On: 06/28/2023 22:33   CT Angio Abd/Pel w/ and/or w/o Result Date: 06/28/2023 CLINICAL DATA:  Recent schema colitis of the transverse and descending colon. EXAM:  CTA ABDOMEN AND PELVIS WITHOUT AND WITH CONTRAST TECHNIQUE: Multidetector CT imaging of the abdomen and pelvis was performed using the standard protocol during bolus administration of intravenous contrast. Multiplanar reconstructed images and MIPs were obtained and reviewed to  evaluate the vascular anatomy. RADIATION DOSE REDUCTION: This exam was performed according to the departmental dose-optimization program which includes automated exposure control, adjustment of the mA and/or kV according to patient size and/or use of iterative reconstruction technique. CONTRAST:  OMNIPAQUE  IOHEXOL  350 MG/ML SOLN COMPARISON:  CT abdomen pelvis dated 06/24/2023. FINDINGS: VASCULAR Aorta: Moderate atherosclerotic calcification of the abdominal aorta. No aneurysmal dilatation or dissection. No periaortic fluid collection. Celiac: Atherosclerotic calcification of the origin of the celiac trunk. The celiac artery and its major branches are patent. SMA: The SMA is patent. Renals: Atherosclerotic calcification of the left renal artery ostium. There is high-grade focal narrowing of the origin of the right renal artery. The renal arteries remain patent. IMA: The IMA is patent. Inflow: Atherosclerotic calcification of the iliac arteries. The iliac arteries are patent. No aneurysmal dilatation or dissection. Proximal Outflow: The visualized proximal outflow is patent. Veins: The IVC is unremarkable. The SMV, splenic vein, and main portal vein are patent. No portal venous gas. Review of the MIP images confirms the above findings. NON-VASCULAR Lower chest: Small bilateral pleural effusions with partial compressive atelectasis of the lower lobes. Pneumonia is not excluded. No intra-abdominal free air.  Small free fluid in the pelvis. Hepatobiliary: Subcentimeter hypodense lesion in the posterior right lobe of the liver is too small to characterize. No biliary ductal dilatation. Cholecystectomy. Pancreas: Indeterminate 1.5 x 2.0 cm hypodense lesion in the head of the pancreas not characterized on this CT, possibly a side branch IPMN. MRI may provide better evaluation on a nonemergent/outpatient basis. No dilatation of the main pancreatic duct. No active inflammatory changes. Spleen: Normal in size without  focal abnormality. Adrenals/Urinary Tract: The adrenal glands are unremarkable. Small bilateral renal cysts. There is no hydronephrosis on either side. The visualized ureters and urinary bladder appear unremarkable. Stomach/Bowel: Long segment thickening and inflammatory changes extending from the distal transverse colon to the rectosigmoid in keeping with colitis. Resolution of the previously seen impacted fecal material in the sigmoid colon. There is no bowel obstruction. The appendix is not visualized with certainty. No inflammatory changes identified in the right lower quadrant. Lymphatic: No adenopathy. Reproductive: Hysterectomy.  No suspicious adnexal masses. Other: None Musculoskeletal: Osteopenia with degenerative changes of spine. No acute osseous pathology. IMPRESSION: 1. Long segment colitis extending from the distal transverse colon to the rectosigmoid. No pneumatosis. No portal venous gas or free air. No bowel obstruction. 2. Small bilateral pleural effusions with partial compressive atelectasis of the lower lobes. 3. Indeterminate 1.5 x 2.0 cm hypodense lesion in the head of the pancreas, possibly a side branch IPMN. MRI may provide better evaluation on a nonemergent/outpatient basis. Electronically Signed   By: Angus Bark M.D.   On: 06/28/2023 09:52   CT HEAD WO CONTRAST ( ) Result Date: 06/27/2023 CLINICAL DATA:  89 year old female with left side neurologic deficit for 4-5 days. EXAM: CT HEAD WITHOUT CONTRAST TECHNIQUE: Contiguous axial images were obtained from the base of the skull through the vertex without intravenous contrast. RADIATION DOSE REDUCTION: This exam was performed according to the departmental dose-optimization program which includes automated exposure control, adjustment of the mA and/or kV according to patient size and/or use of iterative reconstruction technique. COMPARISON:  Head CT 05/20/2023. FINDINGS: Brain: New right thalamic lacunar-type hypodensity  in an area of  about 12 mm (series 2, image 14). This tracks toward the posterior limb right internal capsule and is fairly circumscribed. No associated hemorrhage or mass effect. Stable underlying cerebral volume. Gray-white differentiation elsewhere appears stable. No midline shift, ventriculomegaly, mass effect, evidence of mass lesion, intracranial hemorrhage or evidence of cortically based acute infarction. Gray-white matter differentiation is within normal limits throughout the brain. Vascular: Calcified atherosclerosis at the skull base. Stable basal ganglia vascular calcifications. No suspicious intracranial vascular hyperdensity. Skull: Intact.  No acute osseous abnormality identified. Sinuses/Orbits: Visualized paranasal sinuses and mastoids are stable and well aerated. Other: No gaze deviation, acute orbit or scalp soft tissue finding. IMPRESSION: 1. Right thalamic lacunar infarct, new since 05/20/2023 and most likely subacute in this setting. No associated hemorrhage or mass effect. 2. No other acute intracranial abnormality. Electronically Signed   By: Marlise Simpers M.D.   On: 06/27/2023 16:07    Disposition Plan & Communication  Patient status: Inpatient  Admitted From: Home Planned disposition location: Home health Anticipated discharge date: 4/28 pending finishing clinical workup  Family Communication: sister in law and son at bedside    Author: Ree Candy, DO [facility] 06/29/2023, 8:00 AM   Available by Epic secure chat 7AM-7PM. If 7PM-7AM, please contact night-coverage.  [facility] contact information found on [email or web address].

## 2023-06-29 NOTE — Progress Notes (Signed)
 Mobility Specialist - Progress Note   06/29/23 1017  Mobility  Activity Ambulated with assistance in hallway  Level of Assistance Standby assist, set-up cues, supervision of patient - no hands on  Assistive Device Front wheel walker  Distance Ambulated (ft) 500 ft  Activity Response Tolerated well  Mobility Referral Yes  Mobility visit 1 Mobility  Mobility Specialist Start Time (ACUTE ONLY) 0932  Mobility Specialist Stop Time (ACUTE ONLY) 0945  Mobility Specialist Time Calculation (min) (ACUTE ONLY) 13 min   Pt OOB in bathroom upon arrival. Pt ambulates in hallway SBA with no physical assistance needed. Pt returns to recliner with needs in reach and chair alarm activated.   Wash Hack  Mobility Specialist  06/29/23 10:18 AM

## 2023-06-29 NOTE — Plan of Care (Signed)
   Problem: Education: Goal: Knowledge of disease or condition will improve Outcome: Progressing   Problem: Ischemic Stroke/TIA Tissue Perfusion: Goal: Complications of ischemic stroke/TIA will be minimized Outcome: Progressing

## 2023-06-29 NOTE — Progress Notes (Signed)
 NEUROLOGY CONSULT FOLLOW UP NOTE   Date of service: June 29, 2023 Patient Name: Jacqueline Carney MRN:  [id number] DOB:  01/17/1934  Interval Hx/subjective   Patient reports no significant change Vitals   Vitals:   06/28/23 1517 06/28/23 2106 06/29/23 0443 06/29/23 0819  BP: (!) 144/54 (!) 146/56 (!) 157/63 (!) 150/50  Pulse: 78 79 88 75  Resp: 17 15 14 16   Temp: 98.1 F (36.7 C) 98.4 F (36.9 C) 99.7 F (37.6 C) 98.1 F (36.7 C)  TempSrc: Oral  Oral Oral  SpO2: 96% 98% 94% 96%  Weight:      Height:         Body mass index is 34.88 kg/m.  Physical Exam   Constitutional: Appears well-developed and well-nourished.  Neurologic Examination   She is awake, alert, interactive and appropriate. Medications  Current Facility-Administered Medications:     stroke: early stages of recovery book, , Does not apply, Once, Augustin Leber, MD   acetaminophen  (TYLENOL ) tablet 325 mg, 325 mg, Oral, Q6H PRN, Marnee Sink, MD   amLODipine  (NORVASC ) tablet 10 mg, 10 mg, Oral, Daily, Ole Berkeley, Darren, MD, 10 mg at 06/29/23 4098   aspirin EC tablet 81 mg, 81 mg, Oral, Daily, Ree Candy, MD   calcium -vitamin D (OSCAL WITH D) 500-5 MG-MCG per tablet 1 tablet, 1 tablet, Oral, Daily, Ole Berkeley, Darren, MD, 1 tablet at 06/29/23 1191   ciprofloxacin  (CIPRO ) IVPB 400 mg, 400 mg, Intravenous, Q12H, Eldred Grego, MD, Last Rate: 200 mL/hr at 06/29/23 0636, 400 mg at 06/29/23 0636   escitalopram  (LEXAPRO ) tablet 5 mg, 5 mg, Oral, Daily, Ole Berkeley, Darren, MD, 5 mg at 06/29/23 4782   gabapentin  (NEURONTIN ) capsule 100 mg, 100 mg, Oral, QHS, Wohl, Darren, MD, 100 mg at 06/28/23 2100   metroNIDAZOLE  (FLAGYL ) IVPB 500 mg, 500 mg, Intravenous, Q12H, Eldred Grego, MD, Last Rate: 100 mL/hr at 06/29/23 0901, 500 mg at 06/29/23 0901   mirabegron  ER (MYRBETRIQ ) tablet 25 mg, 25 mg, Oral, Daily, Ole Berkeley, Darren, MD, 25 mg at 06/29/23 9562   morphine  (PF) 2 MG/ML injection 0.5 mg, 0.5 mg,  Intravenous, Q4H PRN, Marnee Sink, MD, 0.5 mg at 06/25/23 1012   ondansetron  (ZOFRAN ) injection 4 mg, 4 mg, Intravenous, Q8H PRN, Marnee Sink, MD, 4 mg at 06/25/23 1736   oxyCODONE  (Oxy IR/ROXICODONE ) immediate release tablet 5 mg, 5 mg, Oral, Q6H PRN, Marnee Sink, MD   pantoprazole  (PROTONIX ) EC tablet 40 mg, 40 mg, Oral, Daily, Ole Berkeley, Darren, MD, 40 mg at 06/29/23 0858  Labs and Diagnostic Imaging   LDL 57 A1c 5.1  Telemetry without evidence of atrial fibrillation Echo from March without embolic source  Imaging(Personally reviewed): MRI brain-small vessel appearing stroke on the right MRA head-mild microvascular disease, subacute stroke is again redemonstrated, no other acute strokes.  She also has a cavernoma on MRI.  Assessment   Jacqueline Carney is a 89 y.o. female with what is likely a small vessel ischemic stroke in the right thalamus.  She already has lipids within range and no evidence of diabetes.  She does have a history of hypertension, and I think this will be our biggest place we can impact with secondary risk factor control.  He is down the timeframe for permissive hypertension, so can correct and treat.  She does have a cavernoma, but I would not favor using this as an exclusion for antiplatelet therapy.  This is especially in the setting of having relatively few other interventions we can do  for secondary stroke prevention.  I would continue aspirin if not contraindicated from a ischemic colitis perspective.  Recommendations  Aspirin 81 mg daily if felt of tolerable risk from ischemic colitis perspective PT, OT, ST Neurology will be available as needed. ______________________________________________________________________   Flint Hummer, MD [facility]

## 2023-06-30 DIAGNOSIS — I6381 Other cerebral infarction due to occlusion or stenosis of small artery: Secondary | ICD-10-CM | POA: Diagnosis not present

## 2023-06-30 DIAGNOSIS — R1030 Lower abdominal pain, unspecified: Secondary | ICD-10-CM | POA: Diagnosis not present

## 2023-06-30 DIAGNOSIS — K559 Vascular disorder of intestine, unspecified: Secondary | ICD-10-CM

## 2023-06-30 LAB — CBC
HCT: 31.6 % — ABNORMAL LOW (ref 36.0–46.0)
Hemoglobin: 11.2 g/dL — ABNORMAL LOW (ref 12.0–15.0)
MCH: 32.3 pg (ref 26.0–34.0)
MCHC: 35.4 g/dL (ref 30.0–36.0)
MCV: 91.1 fL (ref 80.0–100.0)
Platelets: 160 10*3/uL (ref 150–400)
RBC: 3.47 MIL/uL — ABNORMAL LOW (ref 3.87–5.11)
RDW: 12.6 % (ref 11.5–15.5)
WBC: 5.1 10*3/uL (ref 4.0–10.5)
nRBC: 0 % (ref 0.0–0.2)

## 2023-06-30 LAB — BASIC METABOLIC PANEL WITH GFR
Anion gap: 9 (ref 5–15)
BUN: 11 mg/dL (ref 8–23)
CO2: 25 mmol/L (ref 22–32)
Calcium: 8.2 mg/dL — ABNORMAL LOW (ref 8.9–10.3)
Chloride: 104 mmol/L (ref 98–111)
Creatinine, Ser: 0.8 mg/dL (ref 0.44–1.00)
GFR, Estimated: >60 mL/min (ref >60)
Glucose, Bld: 113 mg/dL — ABNORMAL HIGH (ref 70–99)
Potassium: 2.9 mmol/L — ABNORMAL LOW (ref 3.5–5.1)
Sodium: 138 mmol/L (ref 135–145)

## 2023-06-30 LAB — GLUCOSE, CAPILLARY
Glucose-Capillary: 118 mg/dL — ABNORMAL HIGH (ref 70–99)
Glucose-Capillary: 109 mg/dL — ABNORMAL HIGH (ref 70–99)

## 2023-06-30 MED ORDER — POTASSIUM CHLORIDE CRYS ER 20 MEQ PO TBCR
40.0000 meq | EXTENDED_RELEASE_TABLET | Freq: Two times a day (BID) | ORAL | Status: AC
  Administered 2023-06-30 (×2): 40 meq via ORAL
  Filled 2023-06-30 (×2): qty 2

## 2023-06-30 NOTE — Progress Notes (Signed)
 Mobility Specialist - Progress Note   06/30/23 0936  Mobility  Activity Ambulated with assistance in hallway;Ambulated with assistance to bathroom  Level of Assistance Standby assist, set-up cues, supervision of patient - no hands on  Assistive Device Front wheel walker  Distance Ambulated (ft) 1000 ft  Activity Response Tolerated well  Mobility Referral Yes  Mobility visit 1 Mobility  Mobility Specialist Start Time (ACUTE ONLY) 0931  Mobility Specialist Stop Time (ACUTE ONLY) 0944  Mobility Specialist Time Calculation (min) (ACUTE ONLY) 13 min   Pt sitting in recliner on RA upon arrival. Pt STS and ambulates to/from bathroom and multiple laps around NS. Pt returns to chair with needs in reach.   Wash Hack  Mobility Specialist  06/30/23 10:05 AM

## 2023-06-30 NOTE — Progress Notes (Signed)
 Patient ID: Jacqueline Carney, female   DOB: 1933-06-10, 89 y.o.   MRN: [id number]     SURGICAL PROGRESS NOTE   Hospital Day(s): 5.   Interval History: Patient seen and examined, no acute events or new complaints overnight. Patient reports feeling well this morning.  She denies any pain.  She endorses that she had a dark stool.  She denies any chest pain or shortness of breath.  She denies any pain with eating full liquids.  Vital signs in last 24 hours: [min-max] current  Temp:  [98 F (36.7 C)-98.2 F (36.8 C)] 98 F (36.7 C) (04/27 0420) Pulse Rate:  [76-88] 83 (04/27 0420) Resp:  [16] 16 (04/27 0420) BP: (146-157)/(53-55) 157/55 (04/27 0420) SpO2:  [91 %-99 %] 99 % (04/27 0420)     Height: 5' (152.4 cm) Weight: 81 kg BMI (Calculated): 34.88   Physical Exam:  Constitutional: alert, cooperative and no distress  Respiratory: breathing non-labored at rest  Cardiovascular: regular rate and sinus rhythm  Gastrointestinal: soft, non-tender, and non-distended  Labs:     Latest Ref Rng & Units 06/30/2023    5:16 AM 06/28/2023    2:03 AM 06/26/2023    4:04 AM  CBC  WBC 4.0 - 10.5 K/uL 5.1  7.5  7.5   Hemoglobin 12.0 - 15.0 g/dL 21.3  08.6  57.8   Hematocrit 36.0 - 46.0 % 31.6  33.3  37.8   Platelets 150 - 400 K/uL 160  145  143       Latest Ref Rng & Units 06/30/2023    5:16 AM 06/28/2023    2:03 AM 06/26/2023    4:04 AM  CMP  Glucose 70 - 99 mg/dL 469  98  629   BUN 8 - 23 mg/dL 11  13  39   Creatinine 0.44 - 1.00 mg/dL 5.28  4.13  2.44   Sodium 135 - 145 mmol/L 138  136  137   Potassium 3.5 - 5.1 mmol/L 2.9  3.3  3.2   Chloride 98 - 111 mmol/L 104  107  108   CO2 22 - 32 mmol/L 25  23  22    Calcium  8.9 - 10.3 mg/dL 8.2  8.2  7.8   Total Protein 6.5 - 8.1 g/dL   5.1   Total Bilirubin 0.0 - 1.2 mg/dL   1.0   Alkaline Phos 38 - 126 U/L   73   AST 15 - 41 U/L   66   ALT 0 - 44 U/L   63     Imaging studies: No new pertinent imaging studies   Assessment/Plan:  89 y.o.  female with ischemic colitis, complicated by pertinent comorbidities including hypertension, hyperlipidemia, GERD, chronic kidney disease stage IIIa.   Ischemic colitis - Diagnosed on colonoscopy today.  Described as severe ischemic changes but no transmural ulcer identified.  Affected colon includes descending, splenic flexure and transverse colon.  Ascending colon not inspected. - Admission CT scan of the abdomen and pelvis without sign of pneumatosis or perforation. - Repeated CTA of the abdomen and pelvis yesterday shows suspected colitis without progression.  There is no pneumatosis, no portal vein gas. - No clinical deterioration.  Specifically there is no fever, no abdominal pain and white blood cell count today is 5.1. - She tolerated full liquid diet.  She had a bowel movement with dark blood.  Most likely this is likely all the blood. - Will advance diet to soft diet. - Continue IV antibiotic therapy -  Will reevaluate in 24 hours to see if she tolerates soft diet without any problem  Lucila Rye, MD

## 2023-06-30 NOTE — Plan of Care (Signed)
  Problem: Elimination: Goal: Will not experience complications related to bowel motility Outcome: Progressing   Problem: Elimination: Goal: Will not experience complications related to bowel motility Outcome: Progressing   

## 2023-06-30 NOTE — Progress Notes (Signed)
 PROGRESS NOTE Jacqueline Carney    DOB: 05-19-1933, 89 y.o.  HYQ:[id number]    Code Status: Full Code   DOA: 06/24/2023   LOS: 5  Brief hospital course  Jacqueline Carney is a 89 y.o. female with medical history significant of HTN, HLD, prediabetes, GERD, depression, CKD-3A, stroke-like symptoms, degenerative disc disease, vascular ectasia of colon, urinary incontinence, skin cancer, colon polyp, who presents with nausea, vomiting, abdominal pain.   CT scan showed large amount of stool and fluid throughout the colon abruptly ending in the distal sigmoid colon where there was a caliber change, concerning for possible stricture or malignancy. Admitted to hospitalist, consulted GI. VS and Hgb ok Colonoscopy 4/23 significant for ischemic bowel. General surgery consulted and recommended bowel rest with conservative management. Vascular did not recommend any intervention.  Advancing diet as tolerated, continuing IV Abx, fluids.  UPDATE: patient found to have subacute right thalamic lacunar infarct. Evaluated by PT/OT with outpatient recs. Neurology consulted.   Assessment & Plan  Principal Problem:   Abdominal pain Active Problems:   Leukocytosis   Hypertension   Prediabetes   Acute renal failure superimposed on stage 3a chronic kidney disease (HCC)   Abnormal LFTs   Peripheral neuropathy   Depression   Hematochezia   Ischemic colitis (HCC)   Nausea and vomiting  Abdominal pain  ischemic bowel- pain resolved. Passing gas and dark, dried blood clots, per patient. Tolerating full liquid diet. CT scan showed large amount of stool and fluid throughout the colon abruptly ending in the distal sigmoid colon where there was a caliber change, concerning for possible stricture or malignancy.  Colonoscopy 4/23 significant for ischemic bowel. General surgery consulted and recommended bowel rest with conservative management. Vascular did not recommend any intervention.  Advancing diet as tolerated,  continuing IV Abx, fluids. Endorses dark blood in BM without any bright blood. Hgb stable 11.7>11.3 Symptom control for nausea/pain GI consulted colonoscopy 04/23 General surgery following CTA abdomen 4/25- colitis without pneumatosis, bowel obstruction, free air Soft diet today Continue IV abx Can likely transition to PO abx tomorrow if continuing  Follow for signs of bleeding in BM now that starting ASA CBC am  Acute/subacute right thalamic lacunar infarct- seen on head CT. Confirmed with MRI. No LVO. Consistent with described paresthesia on left extremities. PT/OT evaluated and recommended no follow up or outpatient follow up.  - neurology following - continue, close monitoring for GI bleed - CBC am  Mild hypokalemia- monitor and replete PRN - worsened today. Increase potassium supplement    Essential Hypertension- poorly controlled. Will need additional agents.  Continue home amlodipine  10mg  daily   Prediabetes- Recent A1c 5.4, well-controlled.  Patient is not taking medications.   AKI superimposed on stage 3a CKD- resolved back to baseline Avoid using renal toxic medications  Mild abnormal LFTs- improving. Negative acute hepatitis panel Follow hepatic labs    Depression Lexapro     Class 1 obesity based on BMI: Body mass index is 34.88 kg/m.  VTE ppx: SCDs Start: 06/24/23 2027  Diet:     Diet   Diet full liquid Fluid consistency: Thin   Consultants: GI Vascular surgery General surgery  Neurology   Subjective 06/30/23    Patient feels well today. Had no abdominal pain with full liquid diet yesterday. Passing a lot of gas and having dark blood BM. No bright blood or signs of active bleeding. Numbness about the same or improved.   Objective   Vitals:   06/29/23 0819 06/29/23  1626 06/29/23 2006 06/30/23 0420  BP: (!) 150/50 (!) 146/53 (!) 147/55 (!) 157/55  Pulse: 75 88 76 83  Resp: 16 16  16   Temp: 98.1 F (36.7 C) 98.2 F (36.8 C) 98.2 F (36.8 C) 98  F (36.7 C)  TempSrc: Oral Oral Oral   SpO2: 96% 96% 91% 99%  Weight:      Height:        Intake/Output Summary (Last 24 hours) at 06/30/2023 0724 Last data filed at 06/29/2023 1524 Gross per 24 hour  Intake 120 ml  Output --  Net 120 ml   Filed Weights   06/25/23 0800 06/26/23 1329  Weight: 81 kg 81 kg    Physical Exam:  General: awake, alert, NAD Respiratory: normal respiratory effort. Cardiovascular: quick capillary refill, normal S1/S2, RRR, no JVD, murmurs Gastrointestinal: soft, NT, ND Nervous: A&O x3. no gross focal neurologic deficits, normal speech Extremities: moves all equally, no edema, normal tone Skin: dry, intact, normal temperature, normal color. No rashes, lesions or ulcers on exposed skin Psychiatry: normal mood, congruent affect  Labs   I have personally reviewed the following labs and imaging studies CBC    Component Value Date/Time   WBC 5.1 06/30/2023 0516   RBC 3.47 (L) 06/30/2023 0516   HGB 11.2 (L) 06/30/2023 0516   HCT 31.6 (L) 06/30/2023 0516   PLT 160 06/30/2023 0516   MCV 91.1 06/30/2023 0516   MCH 32.3 06/30/2023 0516   MCHC 35.4 06/30/2023 0516   RDW 12.6 06/30/2023 0516   LYMPHSABS 1.8 12/09/2017 0939   MONOABS 0.4 12/09/2017 0939   EOSABS 0.1 12/09/2017 0939   BASOSABS 0.0 12/09/2017 0939      Latest Ref Rng & Units 06/30/2023    5:16 AM 06/28/2023    2:03 AM 06/26/2023    4:04 AM  BMP  Glucose 70 - 99 mg/dL 161  98  096   BUN 8 - 23 mg/dL 11  13  39   Creatinine 0.44 - 1.00 mg/dL 0.45  4.09  8.11   Sodium 135 - 145 mmol/L 138  136  137   Potassium 3.5 - 5.1 mmol/L 2.9  3.3  3.2   Chloride 98 - 111 mmol/L 104  107  108   CO2 22 - 32 mmol/L 25  23  22    Calcium  8.9 - 10.3 mg/dL 8.2  8.2  7.8     MR BRAIN WO CONTRAST Result Date: 06/28/2023 CLINICAL DATA:  Follow-up examination for stroke. EXAM: MRI HEAD WITHOUT CONTRAST MRA HEAD WITHOUT CONTRAST TECHNIQUE: Multiplanar, multi-echo pulse sequences of the brain and surrounding  structures were acquired without intravenous contrast. Angiographic images of the Circle of Willis were acquired using MRA technique without intravenous contrast. COMPARISON:  CT from 06/27/2023. FINDINGS: MRI HEAD FINDINGS Brain: Generalized age-related cerebral atrophy. Patchy T2/FLAIR hyperintensity involving the periventricular deep white matter both cerebral hemispheres, consistent with chronic small vessel ischemic disease, mild for age. 1 cm focus of restricted diffusion involving the right thalamocapsular region, consistent with an acute to early subacute ischemic infarct. No associated hemorrhage or mass effect. No other evidence for acute or subacute ischemia. Gray-white matter differentiation otherwise maintained. No acute intracranial hemorrhage. 4 mm lesion with T2 hypointense rim and blooming artifact on SWI sequence present at the right aspect of the splenium, consistent with a small cavernoma. No other mass lesion, mass effect or midline shift. No hydrocephalus or extra-axial fluid collection. Pituitary gland mildly prominent with convex border superiorly but no  discrete lesion. Vascular: Major intracranial vascular flow voids are maintained. Skull and upper cervical spine: Craniocervical junction within normal limits. Bone marrow signal intensity normal. No scalp soft tissue abnormality. Sinuses/Orbits: Prior bilateral ocular lens replacement. Mucosal thickening present about the left frontoethmoidal recess. Paranasal sinuses are otherwise largely clear. Trace bilateral mastoid effusions, of doubtful significance. Other: None. MRA HEAD FINDINGS Anterior circulation: Both internal carotid arteries are widely patent to the siphons without stenosis or other abnormality. A1 segments patent bilaterally. Normal anterior communicating artery complex. Anterior cerebral arteries patent without significant stenosis. No M1 stenosis or occlusion. Distal MCA branches perfused and symmetric. Posterior  circulation: Both V4 segments patent without significant stenosis. Neither PICA origin well visualized. Basilar patent without stenosis. Superior cerebellar arteries patent bilaterally. Both PCAs primarily supplied via the basilar. PCAs are patent to their distal aspects without hemodynamically significant stenosis. Anatomic variants: None significant.  No intracranial aneurysm. IMPRESSION: MRI HEAD: 1. 1 cm acute to early subacute ischemic nonhemorrhagic right thalamocapsular infarct. 2. Underlying age-related cerebral atrophy with mild chronic microvascular ischemic disease. 3. 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 MRA HEAD: Normal intracranial MRA. Electronically Signed   By: Virgia Griffins M.D.   On: 06/28/2023 22:33   MR ANGIO HEAD WO CONTRAST Result Date: 06/28/2023 CLINICAL DATA:  Follow-up examination for stroke. EXAM: MRI HEAD WITHOUT CONTRAST MRA HEAD WITHOUT CONTRAST TECHNIQUE: Multiplanar, multi-echo pulse sequences of the brain and surrounding structures were acquired without intravenous contrast. Angiographic images of the Circle of Willis were acquired using MRA technique without intravenous contrast. COMPARISON:  CT from 06/27/2023. FINDINGS: MRI HEAD FINDINGS Brain: Generalized age-related cerebral atrophy. Patchy T2/FLAIR hyperintensity involving the periventricular deep white matter both cerebral hemispheres, consistent with chronic small vessel ischemic disease, mild for age. 1 cm focus of restricted diffusion involving the right thalamocapsular region, consistent with an acute to early subacute ischemic infarct. No associated hemorrhage or mass effect. No other evidence for acute or subacute ischemia. Gray-white matter differentiation otherwise maintained. No acute intracranial hemorrhage. 4 mm lesion with T2 hypointense rim and blooming artifact on SWI sequence present at the right aspect of the splenium, consistent with a small cavernoma. No other mass lesion, mass  effect or midline shift. No hydrocephalus or extra-axial fluid collection. Pituitary gland mildly prominent with convex border superiorly but no discrete lesion. Vascular: Major intracranial vascular flow voids are maintained. Skull and upper cervical spine: Craniocervical junction within normal limits. Bone marrow signal intensity normal. No scalp soft tissue abnormality. Sinuses/Orbits: Prior bilateral ocular lens replacement. Mucosal thickening present about the left frontoethmoidal recess. Paranasal sinuses are otherwise largely clear. Trace bilateral mastoid effusions, of doubtful significance. Other: None. MRA HEAD FINDINGS Anterior circulation: Both internal carotid arteries are widely patent to the siphons without stenosis or other abnormality. A1 segments patent bilaterally. Normal anterior communicating artery complex. Anterior cerebral arteries patent without significant stenosis. No M1 stenosis or occlusion. Distal MCA branches perfused and symmetric. Posterior circulation: Both V4 segments patent without significant stenosis. Neither PICA origin well visualized. Basilar patent without stenosis. Superior cerebellar arteries patent bilaterally. Both PCAs primarily supplied via the basilar. PCAs are patent to their distal aspects without hemodynamically significant stenosis. Anatomic variants: None significant.  No intracranial aneurysm. IMPRESSION: MRI HEAD: 1. 1 cm acute to early subacute ischemic nonhemorrhagic right thalamocapsular infarct. 2. Underlying age-related cerebral atrophy with mild chronic microvascular ischemic disease. 3. 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 MRA HEAD: Normal intracranial MRA. Electronically Signed  By: Virgia Griffins M.D.   On: 06/28/2023 22:33   CT Angio Abd/Pel w/ and/or w/o Result Date: 06/28/2023 CLINICAL DATA:  Recent schema colitis of the transverse and descending colon. EXAM: CTA ABDOMEN AND PELVIS WITHOUT AND WITH CONTRAST TECHNIQUE:  Multidetector CT imaging of the abdomen and pelvis was performed using the standard protocol during bolus administration of intravenous contrast. Multiplanar reconstructed images and MIPs were obtained and reviewed to evaluate the vascular anatomy. RADIATION DOSE REDUCTION: This exam was performed according to the departmental dose-optimization program which includes automated exposure control, adjustment of the mA and/or kV according to patient size and/or use of iterative reconstruction technique. CONTRAST:  OMNIPAQUE  IOHEXOL  350 MG/ML SOLN COMPARISON:  CT abdomen pelvis dated 06/24/2023. FINDINGS: VASCULAR Aorta: Moderate atherosclerotic calcification of the abdominal aorta. No aneurysmal dilatation or dissection. No periaortic fluid collection. Celiac: Atherosclerotic calcification of the origin of the celiac trunk. The celiac artery and its major branches are patent. SMA: The SMA is patent. Renals: Atherosclerotic calcification of the left renal artery ostium. There is high-grade focal narrowing of the origin of the right renal artery. The renal arteries remain patent. IMA: The IMA is patent. Inflow: Atherosclerotic calcification of the iliac arteries. The iliac arteries are patent. No aneurysmal dilatation or dissection. Proximal Outflow: The visualized proximal outflow is patent. Veins: The IVC is unremarkable. The SMV, splenic vein, and main portal vein are patent. No portal venous gas. Review of the MIP images confirms the above findings. NON-VASCULAR Lower chest: Small bilateral pleural effusions with partial compressive atelectasis of the lower lobes. Pneumonia is not excluded. No intra-abdominal free air.  Small free fluid in the pelvis. Hepatobiliary: Subcentimeter hypodense lesion in the posterior right lobe of the liver is too small to characterize. No biliary ductal dilatation. Cholecystectomy. Pancreas: Indeterminate 1.5 x 2.0 cm hypodense lesion in the head of the pancreas not characterized  on this CT, possibly a side branch IPMN. MRI may provide better evaluation on a nonemergent/outpatient basis. No dilatation of the main pancreatic duct. No active inflammatory changes. Spleen: Normal in size without focal abnormality. Adrenals/Urinary Tract: The adrenal glands are unremarkable. Small bilateral renal cysts. There is no hydronephrosis on either side. The visualized ureters and urinary bladder appear unremarkable. Stomach/Bowel: Long segment thickening and inflammatory changes extending from the distal transverse colon to the rectosigmoid in keeping with colitis. Resolution of the previously seen impacted fecal material in the sigmoid colon. There is no bowel obstruction. The appendix is not visualized with certainty. No inflammatory changes identified in the right lower quadrant. Lymphatic: No adenopathy. Reproductive: Hysterectomy.  No suspicious adnexal masses. Other: None Musculoskeletal: Osteopenia with degenerative changes of spine. No acute osseous pathology. IMPRESSION: 1. Long segment colitis extending from the distal transverse colon to the rectosigmoid. No pneumatosis. No portal venous gas or free air. No bowel obstruction. 2. Small bilateral pleural effusions with partial compressive atelectasis of the lower lobes. 3. Indeterminate 1.5 x 2.0 cm hypodense lesion in the head of the pancreas, possibly a side branch IPMN. MRI may provide better evaluation on a nonemergent/outpatient basis. Electronically Signed   By: Angus Bark M.D.   On: 06/28/2023 09:52    Disposition Plan & Communication  Patient status: Inpatient  Admitted From: Home Planned disposition location: Home health Anticipated discharge date: 4/28 pending finishing clinical workup  Family Communication: none at bedside    Author: Ree Candy, DO [facility] 06/30/2023, 7:24 AM   Available by Epic secure chat 7AM-7PM. If 7PM-7AM, please  contact night-coverage.  [facility] contact information found on  [email or web address].

## 2023-07-01 ENCOUNTER — Other Ambulatory Visit: Payer: Self-pay

## 2023-07-01 DIAGNOSIS — R112 Nausea with vomiting, unspecified: Secondary | ICD-10-CM | POA: Diagnosis not present

## 2023-07-01 DIAGNOSIS — R1084 Generalized abdominal pain: Secondary | ICD-10-CM | POA: Diagnosis not present

## 2023-07-01 DIAGNOSIS — K559 Vascular disorder of intestine, unspecified: Secondary | ICD-10-CM | POA: Diagnosis not present

## 2023-07-01 DIAGNOSIS — I6381 Other cerebral infarction due to occlusion or stenosis of small artery: Secondary | ICD-10-CM | POA: Diagnosis not present

## 2023-07-01 LAB — CBC
HCT: 32.3 % — ABNORMAL LOW (ref 36.0–46.0)
Hemoglobin: 11.1 g/dL — ABNORMAL LOW (ref 12.0–15.0)
MCH: 32 pg (ref 26.0–34.0)
MCHC: 34.4 g/dL (ref 30.0–36.0)
MCV: 93.1 fL (ref 80.0–100.0)
Platelets: 179 10*3/uL (ref 150–400)
RBC: 3.47 MIL/uL — ABNORMAL LOW (ref 3.87–5.11)
RDW: 12.7 % (ref 11.5–15.5)
WBC: 5.7 10*3/uL (ref 4.0–10.5)
nRBC: 0 % (ref 0.0–0.2)

## 2023-07-01 LAB — BASIC METABOLIC PANEL WITH GFR
Anion gap: 8 (ref 5–15)
BUN: 13 mg/dL (ref 8–23)
CO2: 26 mmol/L (ref 22–32)
Calcium: 8.1 mg/dL — ABNORMAL LOW (ref 8.9–10.3)
Chloride: 107 mmol/L (ref 98–111)
Creatinine, Ser: 0.87 mg/dL (ref 0.44–1.00)
GFR, Estimated: >60 mL/min (ref >60)
Glucose, Bld: 103 mg/dL — ABNORMAL HIGH (ref 70–99)
Potassium: 4.2 mmol/L (ref 3.5–5.1)
Sodium: 141 mmol/L (ref 135–145)

## 2023-07-01 LAB — GLUCOSE, CAPILLARY: Glucose-Capillary: 115 mg/dL — ABNORMAL HIGH (ref 70–99)

## 2023-07-01 MED ORDER — CIPROFLOXACIN HCL 500 MG PO TABS
500.0000 mg | ORAL_TABLET | Freq: Two times a day (BID) | ORAL | 0 refills | Status: AC
  Filled 2023-07-01: qty 20, 10d supply, fill #0

## 2023-07-01 MED ORDER — ASPIRIN 81 MG PO TBEC: 81.0000 mg | DELAYED_RELEASE_TABLET | Freq: Every day | ORAL | 0 refills | Status: AC

## 2023-07-01 MED ORDER — METRONIDAZOLE 500 MG PO TABS
500.0000 mg | ORAL_TABLET | Freq: Three times a day (TID) | ORAL | 0 refills | Status: AC
  Filled 2023-07-01: qty 30, 10d supply, fill #0

## 2023-07-01 MED ORDER — CULTURELLE HEALTH & WELLNESS PO CAPS
1.0000 | ORAL_CAPSULE | Freq: Three times a day (TID) | ORAL | 0 refills | Status: AC
  Filled 2023-07-01: qty 30, 10d supply, fill #0
  Filled 2023-07-15: qty 60, 20d supply, fill #1

## 2023-07-01 NOTE — Progress Notes (Incomplete)
 PROGRESS NOTE Jacqueline Carney    DOB: [birth year], 89 y.o.  ZHY:[id number]    Code Status: Full Code   DOA: 06/24/2023   LOS: 6  Brief hospital course  Jacqueline Carney is a 89 y.o. female with medical history significant of HTN, HLD, prediabetes, GERD, depression, CKD-3A, stroke-like symptoms, degenerative disc disease, vascular ectasia of colon, urinary incontinence, skin cancer, colon polyp, who presents with nausea, vomiting, abdominal pain.   CT scan showed large amount of stool and fluid throughout the colon abruptly ending in the distal sigmoid colon where there was a caliber change, concerning for possible stricture or malignancy. Admitted to hospitalist, consulted GI. VS and Hgb ok Colonoscopy 4/23 significant for ischemic bowel. General surgery consulted and recommended bowel rest with conservative management. Vascular did not recommend any intervention.  Advancing diet as tolerated, continuing IV Abx, fluids.  UPDATE: patient found to have subacute right thalamic lacunar infarct. Evaluated by PT/OT with outpatient recs. Neurology consulted.   Assessment & Plan  Principal Problem:   Abdominal pain Active Problems:   Leukocytosis   Hypertension   Prediabetes   Acute renal failure superimposed on stage 3a chronic kidney disease (HCC)   Abnormal LFTs   Peripheral neuropathy   Depression   Hematochezia   Ischemic colitis (HCC)   Nausea and vomiting   Acute thalamic infarction (HCC)   Ischemia, bowel (HCC)  Abdominal pain  ischemic bowel- pain resolved. Passing gas and dark, dried blood clots, per patient. Tolerating full liquid diet. CT scan showed large amount of stool and fluid throughout the colon abruptly ending in the distal sigmoid colon where there was a caliber change, concerning for possible stricture or malignancy.  Colonoscopy 4/23 significant for ischemic bowel. General surgery consulted and recommended bowel rest with conservative management. Vascular did  not recommend any intervention.  Advancing diet as tolerated, continuing IV Abx, fluids. Endorses dark blood in BM without any bright blood. Hgb stable 11.7>11.3 Symptom control for nausea/pain GI consulted colonoscopy 04/23 General surgery following CTA abdomen 4/25- colitis without pneumatosis, bowel obstruction, free air Soft diet today Continue IV abx Can likely transition to PO abx tomorrow if continuing  Follow for signs of bleeding in BM now that starting ASA CBC am  Acute/subacute right thalamic lacunar infarct- seen on head CT. Confirmed with MRI. No LVO. Consistent with described paresthesia on left extremities. PT/OT evaluated and recommended no follow up or outpatient follow up.  - neurology following - continue, close monitoring for GI bleed - CBC am  Mild hypokalemia- monitor and replete PRN - worsened today. Increase potassium supplement    Essential Hypertension- poorly controlled. Will need additional agents.  Continue home amlodipine  10mg  daily   Prediabetes- Recent A1c 5.4, well-controlled.  Patient is not taking medications.   AKI superimposed on stage 3a CKD- resolved back to baseline Avoid using renal toxic medications  Mild abnormal LFTs- improving. Negative acute hepatitis panel Follow hepatic labs    Depression Lexapro     Class 1 obesity based on BMI: Body mass index is 34.88 kg/m.  VTE ppx: SCDs Start: 06/24/23 2027  Diet:     Diet   DIET SOFT Fluid consistency: Thin   Consultants: GI Vascular surgery General surgery  Neurology   Subjective 07/01/23    Patient feels well today. Had no abdominal pain with full liquid diet yesterday. Passing a lot of gas and having dark blood BM. No bright blood or signs of active bleeding. Numbness about the same or improved.  Objective   Vitals:   06/30/23 0859 06/30/23 1625 06/30/23 2018 07/01/23 0432  BP: (!) 143/49 (!) 143/56 (!) 150/49 (!) 136/50  Pulse: 80 87 84 82  Resp: 16 17 19 17    Temp: 98.8 F (37.1 C) 99.2 F (37.3 C) 98.8 F (37.1 C) 98.7 F (37.1 C)  TempSrc:   Oral Oral  SpO2: 97% 95% 96% 95%  Weight:      Height:        Intake/Output Summary (Last 24 hours) at 07/01/2023 0717 Last data filed at 06/30/2023 1501 Gross per 24 hour  Intake 1432.76 ml  Output --  Net 1432.76 ml   Filed Weights   06/25/23 0800 06/26/23 1329  Weight: 81 kg 81 kg    Physical Exam:  General: awake, alert, NAD Respiratory: normal respiratory effort. Cardiovascular: quick capillary refill, normal S1/S2, RRR, no JVD, murmurs Gastrointestinal: soft, NT, ND Nervous: A&O x3. no gross focal neurologic deficits, normal speech Extremities: moves all equally, no edema, normal tone Skin: dry, intact, normal temperature, normal color. No rashes, lesions or ulcers on exposed skin Psychiatry: normal mood, congruent affect  Labs   I have personally reviewed the following labs and imaging studies CBC    Component Value Date/Time   WBC 5.7 07/01/2023 0551   RBC 3.47 (L) 07/01/2023 0551   HGB 11.1 (L) 07/01/2023 0551   HCT 32.3 (L) 07/01/2023 0551   PLT 179 07/01/2023 0551   MCV 93.1 07/01/2023 0551   MCH 32.0 07/01/2023 0551   MCHC 34.4 07/01/2023 0551   RDW 12.7 07/01/2023 0551   LYMPHSABS 1.8 12/09/2017 0939   MONOABS 0.4 12/09/2017 0939   EOSABS 0.1 12/09/2017 0939   BASOSABS 0.0 12/09/2017 0939      Latest Ref Rng & Units 07/01/2023    5:51 AM 06/30/2023    5:16 AM 06/28/2023    2:03 AM  BMP  Glucose 70 - 99 mg/dL 161  096  98   BUN 8 - 23 mg/dL 13  11  13    Creatinine 0.44 - 1.00 mg/dL 0.45  4.09  8.11   Sodium 135 - 145 mmol/L 141  138  136   Potassium 3.5 - 5.1 mmol/L 4.2  2.9  3.3   Chloride 98 - 111 mmol/L 107  104  107   CO2 22 - 32 mmol/L 26  25  23    Calcium  8.9 - 10.3 mg/dL 8.1  8.2  8.2     No results found.   Disposition Plan & Communication  Patient status: Inpatient  Admitted From: Home Planned disposition location: Home health Anticipated  discharge date: 4/28 pending finishing clinical workup  Family Communication: none at bedside    Author: Ree Candy, DO [facility] 07/01/2023, 7:17 AM   Available by Epic secure chat 7AM-7PM. If 7PM-7AM, please contact night-coverage.  [facility] contact information found on [email or web address].

## 2023-07-01 NOTE — Plan of Care (Signed)
 Problem: Education: Goal: Knowledge of General Education information will improve Description: Including pain rating scale, medication(s)/side effects and non-pharmacologic comfort measures 07/01/2023 0555 by Brant Caldron T, LPN Outcome: Progressing 07/01/2023 0554 by Sonny Dust, LPN Outcome: Progressing   Problem: Health Behavior/Discharge Planning: Goal: Ability to manage health-related needs will improve 07/01/2023 0555 by Brant Caldron T, LPN Outcome: Progressing 07/01/2023 0554 by Brant Caldron T, LPN Outcome: Progressing   Problem: Clinical Measurements: Goal: Ability to maintain clinical measurements within normal limits will improve 07/01/2023 0555 by Brant Caldron T, LPN Outcome: Progressing 07/01/2023 0554 by Brant Caldron T, LPN Outcome: Progressing Goal: Will remain free from infection 07/01/2023 0555 by Brant Caldron T, LPN Outcome: Progressing 07/01/2023 0554 by Brant Caldron T, LPN Outcome: Progressing Goal: Diagnostic test results will improve 07/01/2023 0555 by Brant Caldron T, LPN Outcome: Progressing 07/01/2023 0554 by Brant Caldron T, LPN Outcome: Progressing Goal: Respiratory complications will improve 07/01/2023 0555 by Brant Caldron T, LPN Outcome: Progressing 07/01/2023 0554 by Brant Caldron T, LPN Outcome: Progressing Goal: Cardiovascular complication will be avoided 07/01/2023 0555 by Brant Caldron T, LPN Outcome: Progressing 07/01/2023 0554 by Brant Caldron T, LPN Outcome: Progressing   Problem: Activity: Goal: Risk for activity intolerance will decrease 07/01/2023 0555 by Brant Caldron T, LPN Outcome: Progressing 07/01/2023 0554 by Brant Caldron T, LPN Outcome: Progressing   Problem: Nutrition: Goal: Adequate nutrition will be maintained 07/01/2023 0555 by Brant Caldron T, LPN Outcome: Progressing 07/01/2023 0554 by Brant Caldron T, LPN Outcome: Progressing   Problem: Coping: Goal: Level of anxiety will decrease 07/01/2023  0555 by Brant Caldron T, LPN Outcome: Progressing 07/01/2023 0554 by Brant Caldron T, LPN Outcome: Progressing   Problem: Elimination: Goal: Will not experience complications related to bowel motility 07/01/2023 0555 by Brant Caldron T, LPN Outcome: Progressing 07/01/2023 0554 by Brant Caldron T, LPN Outcome: Progressing Goal: Will not experience complications related to urinary retention 07/01/2023 0555 by Brant Caldron T, LPN Outcome: Progressing 07/01/2023 0554 by Brant Caldron T, LPN Outcome: Progressing   Problem: Pain Managment: Goal: General experience of comfort will improve and/or be controlled 07/01/2023 0555 by Brant Caldron T, LPN Outcome: Progressing 07/01/2023 0554 by Brant Caldron T, LPN Outcome: Progressing   Problem: Safety: Goal: Ability to remain free from injury will improve 07/01/2023 0555 by Brant Caldron T, LPN Outcome: Progressing 07/01/2023 0554 by Brant Caldron T, LPN Outcome: Progressing   Problem: Skin Integrity: Goal: Risk for impaired skin integrity will decrease 07/01/2023 0555 by Brant Caldron T, LPN Outcome: Progressing 07/01/2023 0554 by Brant Caldron T, LPN Outcome: Progressing   Problem: Education: Goal: Knowledge of disease or condition will improve 07/01/2023 0555 by Brant Caldron T, LPN Outcome: Progressing 07/01/2023 0554 by Brant Caldron T, LPN Outcome: Progressing Goal: Knowledge of secondary prevention will improve (MUST DOCUMENT ALL) 07/01/2023 0555 by Brant Caldron T, LPN Outcome: Progressing 07/01/2023 0554 by Brant Caldron T, LPN Outcome: Progressing Goal: Knowledge of patient specific risk factors will improve (DELETE if not current risk factor) 07/01/2023 0555 by Brant Caldron T, LPN Outcome: Progressing 07/01/2023 0554 by Brant Caldron T, LPN Outcome: Progressing   Problem: Ischemic Stroke/TIA Tissue Perfusion: Goal: Complications of ischemic stroke/TIA will be minimized 07/01/2023 0555 by Brant Caldron T,  LPN Outcome: Progressing 07/01/2023 0554 by Brant Caldron T, LPN Outcome: Progressing   Problem: Coping: Goal: Will verbalize positive feelings about self 07/01/2023 0555 by Brant Caldron T, LPN Outcome: Progressing 07/01/2023 0554 by Brant Caldron T, LPN Outcome: Progressing Goal: Will identify appropriate support needs 07/01/2023  1324 by Brant Caldron T, LPN Outcome: Progressing 07/01/2023 0554 by Sonny Dust, LPN Outcome: Progressing   Problem: Health Behavior/Discharge Planning: Goal: Ability to manage health-related needs will improve 07/01/2023 0555 by Brant Caldron T, LPN Outcome: Progressing 07/01/2023 0554 by Sonny Dust, LPN Outcome: Progressing Goal: Goals will be collaboratively established with patient/family 07/01/2023 0555 by Brant Caldron T, LPN Outcome: Progressing 07/01/2023 0554 by Brant Caldron T, LPN Outcome: Progressing   Problem: Self-Care: Goal: Ability to participate in self-care as condition permits will improve 07/01/2023 0555 by Brant Caldron T, LPN Outcome: Progressing 07/01/2023 0554 by Brant Caldron T, LPN Outcome: Progressing Goal: Verbalization of feelings and concerns over difficulty with self-care will improve 07/01/2023 0555 by Brant Caldron T, LPN Outcome: Progressing 07/01/2023 0554 by Brant Caldron T, LPN Outcome: Progressing Goal: Ability to communicate needs accurately will improve 07/01/2023 0555 by Brant Caldron T, LPN Outcome: Progressing 07/01/2023 0554 by Brant Caldron T, LPN Outcome: Progressing   Problem: Nutrition: Goal: Risk of aspiration will decrease 07/01/2023 0555 by Brant Caldron T, LPN Outcome: Progressing 07/01/2023 0554 by Brant Caldron T, LPN Outcome: Progressing Goal: Dietary intake will improve 07/01/2023 0555 by Brant Caldron T, LPN Outcome: Progressing 07/01/2023 0554 by Sonny Dust, LPN Outcome: Progressing

## 2023-07-01 NOTE — Discharge Summary (Signed)
 Physician Discharge Summary  Patient: Jacqueline Carney OZH:[id number] DOB: March 31, 1933   Code Status: Full Code Admit date: 06/24/2023 Discharge date: 07/01/2023 Disposition: Home, outpatient PT PCP: Melchor Spoon, MD  Recommendations for Outpatient Follow-up:  Follow up with PCP within 1-2 weeks Regarding general hospital follow up and preventative care Recommend  Follow up with neurology Regarding stroke f/u Follow up with GI/general surgery Evaluate for repeat imaging, endoscopy for ischemic colitis monitoring   Discharge Diagnoses:  Principal Problem:   Abdominal pain Active Problems:   Leukocytosis   Hypertension   Prediabetes   Acute renal failure superimposed on stage 3a chronic kidney disease (HCC)   Abnormal LFTs   Peripheral neuropathy   Depression   Hematochezia   Ischemic colitis (HCC)   Nausea and vomiting   Acute thalamic infarction (HCC)   Ischemia, bowel [facility])  Brief Hospital Course Summary: Jacqueline Carney is a 89 y.o. female with medical history significant of HTN, HLD, prediabetes, GERD, depression, CKD-3A, stroke-like symptoms, degenerative disc disease, vascular ectasia of colon, urinary incontinence, skin cancer, colon polyp, who presents with nausea, vomiting, abdominal pain.    CT scan showed large amount of stool and fluid throughout the colon abruptly ending in the distal sigmoid colon where there was a caliber change, concerning for possible stricture or malignancy.  Colonoscopy 4/23 significant for ischemic bowel. General surgery consulted and recommended bowel rest with conservative management. Vascular did not recommend any intervention.  With bowel rest and prophylactic IV Abx, she gradually had improvement with conservative management. Gradually added on diet and tolerated well. Discharged home on soft diet. Hgb 11.1 on day of dc. Discharged with a continued ppx antibiotic PO course. Will need follow up outpatient with GI/surgery.    Additionally, patient found to have subacute right thalamic lacunar infarct on brain MRI after complaining of Left hand and foot numbness that was intermittently present prior to admission and consistent after arrival.  Neurology consulted. She was started on aspirin. No additional bleeding in stool and hgb remained stable.    Evaluated by PT/OT with outpatient recs.  All other chronic conditions were treated with home medications.    Discharge Condition: Stable, improved Recommended discharge diet:  soft diet  Consultations: GI General surgery Vascular surgery Neurology   Procedures/Studies: Colonoscopy Brain MRI  Discharge Instructions     Ambulatory referral to Gastroenterology   Complete by: As directed    What is the reason for referral?: Colonoscopy   Ambulatory referral to Neurology   Complete by: As directed    An appointment is requested in approximately: 4 weeks   Ambulatory referral to Physical Therapy   Complete by: As directed    For PT at [facility], verify address and phone number.  [facility] will contact family to schedule appointment. For referral to a different hospital or provider, change to External referral, note which hospital/provider is desired.  Put in check out note to send patient to referral coordinator.   Iontophoresis - 4 mg/ml of dexamethasone: No   T.E.N.S. Unit Evaluation and Dispense as Indicated: No      Allergies as of 07/01/2023   No Known Allergies      Medication List     TAKE these medications    amLODipine  10 MG tablet Commonly known as: NORVASC  Take 10 mg by mouth daily.   aspirin EC 81 MG tablet Take 1 tablet (81 mg total) by mouth daily. Swallow whole. Start taking on: July 02, 2023  Calcium  Carbonate-Vitamin D 600-400 MG-UNIT tablet Take 1 tablet by mouth daily.   ciprofloxacin  500 MG tablet Commonly known as: Cipro  Take 1 tablet (500 mg total) by mouth 2 (two) times daily for 10 days.    Culturelle Immunity Support Caps Take 1 tablet by mouth 3 (three) times daily with meals.   escitalopram  5 MG tablet Commonly known as: LEXAPRO  Take 5 mg by mouth daily.   Gemtesa  75 MG Tabs Generic drug: Vibegron  Take 1 tablet (75 mg total) by mouth daily.   metroNIDAZOLE  500 MG tablet Commonly known as: Flagyl  Take 1 tablet (500 mg total) by mouth 3 (three) times daily for 10 days.   pantoprazole  40 MG tablet Commonly known as: PROTONIX  Take 40 mg by mouth daily.               Durable Medical Equipment  (From admission, onward)           Start     Ordered   06/28/23 1328  For home use only DME Walker rolling  Once       Question Answer Comment  Walker: With 5 Inch Wheels   Patient needs a walker to treat with the following condition Neuropathy   Patient needs a walker to treat with the following condition CVA (cerebral vascular accident) (HCC)      06/28/23 1328           Subjective   Pt reports feeling great. Denies abdominal pain. Passing a lot of gas. No bright red blood in stools but still sees dark black in stools. Numbness in her foot is improved today.   All questions and concerns were addressed at time of discharge.  Objective  Blood pressure 139/66, pulse 89, temperature 98 F (36.7 C), resp. rate 18, height 5' (1.524 m), weight 81 kg, SpO2 100%.   General: Pt is alert, awake, not in acute distress Cardiovascular: RRR, S1/S2 +, no rubs, no gallops Respiratory: CTA bilaterally, no wheezing, no rhonchi Abdominal: Soft, NT, ND, bowel sounds + Extremities: no edema, no cyanosis  The results of significant diagnostics from this hospitalization (including imaging, microbiology, ancillary and laboratory) are listed below for reference.   Imaging studies: MR BRAIN WO CONTRAST Result Date: 06/28/2023 CLINICAL DATA:  Follow-up examination for stroke. EXAM: MRI HEAD WITHOUT CONTRAST MRA HEAD WITHOUT CONTRAST TECHNIQUE: Multiplanar, multi-echo pulse  sequences of the brain and surrounding structures were acquired without intravenous contrast. Angiographic images of the Circle of Willis were acquired using MRA technique without intravenous contrast. COMPARISON:  CT from 06/27/2023. FINDINGS: MRI HEAD FINDINGS Brain: Generalized age-related cerebral atrophy. Patchy T2/FLAIR hyperintensity involving the periventricular deep white matter both cerebral hemispheres, consistent with chronic small vessel ischemic disease, mild for age. 1 cm focus of restricted diffusion involving the right thalamocapsular region, consistent with an acute to early subacute ischemic infarct. No associated hemorrhage or mass effect. No other evidence for acute or subacute ischemia. Gray-white matter differentiation otherwise maintained. No acute intracranial hemorrhage. 4 mm lesion with T2 hypointense rim and blooming artifact on SWI sequence present at the right aspect of the splenium, consistent with a small cavernoma. No other mass lesion, mass effect or midline shift. No hydrocephalus or extra-axial fluid collection. Pituitary gland mildly prominent with convex border superiorly but no discrete lesion. Vascular: Major intracranial vascular flow voids are maintained. Skull and upper cervical spine: Craniocervical junction within normal limits. Bone marrow signal intensity normal. No scalp soft tissue abnormality. Sinuses/Orbits: Prior bilateral ocular lens replacement. Mucosal thickening  present about the left frontoethmoidal recess. Paranasal sinuses are otherwise largely clear. Trace bilateral mastoid effusions, of doubtful significance. Other: None. MRA HEAD FINDINGS Anterior circulation: Both internal carotid arteries are widely patent to the siphons without stenosis or other abnormality. A1 segments patent bilaterally. Normal anterior communicating artery complex. Anterior cerebral arteries patent without significant stenosis. No M1 stenosis or occlusion. Distal MCA branches  perfused and symmetric. Posterior circulation: Both V4 segments patent without significant stenosis. Neither PICA origin well visualized. Basilar patent without stenosis. Superior cerebellar arteries patent bilaterally. Both PCAs primarily supplied via the basilar. PCAs are patent to their distal aspects without hemodynamically significant stenosis. Anatomic variants: None significant.  No intracranial aneurysm. IMPRESSION: MRI HEAD: 1. 1 cm acute to early subacute ischemic nonhemorrhagic right thalamocapsular infarct. 2. Underlying age-related cerebral atrophy with mild chronic microvascular ischemic disease. 3. 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 MRA HEAD: Normal intracranial MRA. Electronically Signed   By: Virgia Griffins M.D.   On: 06/28/2023 22:33   MR ANGIO HEAD WO CONTRAST Result Date: 06/28/2023 CLINICAL DATA:  Follow-up examination for stroke. EXAM: MRI HEAD WITHOUT CONTRAST MRA HEAD WITHOUT CONTRAST TECHNIQUE: Multiplanar, multi-echo pulse sequences of the brain and surrounding structures were acquired without intravenous contrast. Angiographic images of the Circle of Willis were acquired using MRA technique without intravenous contrast. COMPARISON:  CT from 06/27/2023. FINDINGS: MRI HEAD FINDINGS Brain: Generalized age-related cerebral atrophy. Patchy T2/FLAIR hyperintensity involving the periventricular deep white matter both cerebral hemispheres, consistent with chronic small vessel ischemic disease, mild for age. 1 cm focus of restricted diffusion involving the right thalamocapsular region, consistent with an acute to early subacute ischemic infarct. No associated hemorrhage or mass effect. No other evidence for acute or subacute ischemia. Gray-white matter differentiation otherwise maintained. No acute intracranial hemorrhage. 4 mm lesion with T2 hypointense rim and blooming artifact on SWI sequence present at the right aspect of the splenium, consistent with a small  cavernoma. No other mass lesion, mass effect or midline shift. No hydrocephalus or extra-axial fluid collection. Pituitary gland mildly prominent with convex border superiorly but no discrete lesion. Vascular: Major intracranial vascular flow voids are maintained. Skull and upper cervical spine: Craniocervical junction within normal limits. Bone marrow signal intensity normal. No scalp soft tissue abnormality. Sinuses/Orbits: Prior bilateral ocular lens replacement. Mucosal thickening present about the left frontoethmoidal recess. Paranasal sinuses are otherwise largely clear. Trace bilateral mastoid effusions, of doubtful significance. Other: None. MRA HEAD FINDINGS Anterior circulation: Both internal carotid arteries are widely patent to the siphons without stenosis or other abnormality. A1 segments patent bilaterally. Normal anterior communicating artery complex. Anterior cerebral arteries patent without significant stenosis. No M1 stenosis or occlusion. Distal MCA branches perfused and symmetric. Posterior circulation: Both V4 segments patent without significant stenosis. Neither PICA origin well visualized. Basilar patent without stenosis. Superior cerebellar arteries patent bilaterally. Both PCAs primarily supplied via the basilar. PCAs are patent to their distal aspects without hemodynamically significant stenosis. Anatomic variants: None significant.  No intracranial aneurysm. IMPRESSION: MRI HEAD: 1. 1 cm acute to early subacute ischemic nonhemorrhagic right thalamocapsular infarct. 2. Underlying age-related cerebral atrophy with mild chronic microvascular ischemic disease. 3. 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 MRA HEAD: Normal intracranial MRA. Electronically Signed   By: Virgia Griffins M.D.   On: 06/28/2023 22:33   CT Angio Abd/Pel w/ and/or w/o Result Date: 06/28/2023 CLINICAL DATA:  Recent schema colitis of the transverse and descending colon. EXAM: CTA ABDOMEN AND  PELVIS  WITHOUT AND WITH CONTRAST TECHNIQUE: Multidetector CT imaging of the abdomen and pelvis was performed using the standard protocol during bolus administration of intravenous contrast. Multiplanar reconstructed images and MIPs were obtained and reviewed to evaluate the vascular anatomy. RADIATION DOSE REDUCTION: This exam was performed according to the departmental dose-optimization program which includes automated exposure control, adjustment of the mA and/or kV according to patient size and/or use of iterative reconstruction technique. CONTRAST:  OMNIPAQUE  IOHEXOL  350 MG/ML SOLN COMPARISON:  CT abdomen pelvis dated 06/24/2023. FINDINGS: VASCULAR Aorta: Moderate atherosclerotic calcification of the abdominal aorta. No aneurysmal dilatation or dissection. No periaortic fluid collection. Celiac: Atherosclerotic calcification of the origin of the celiac trunk. The celiac artery and its major branches are patent. SMA: The SMA is patent. Renals: Atherosclerotic calcification of the left renal artery ostium. There is high-grade focal narrowing of the origin of the right renal artery. The renal arteries remain patent. IMA: The IMA is patent. Inflow: Atherosclerotic calcification of the iliac arteries. The iliac arteries are patent. No aneurysmal dilatation or dissection. Proximal Outflow: The visualized proximal outflow is patent. Veins: The IVC is unremarkable. The SMV, splenic vein, and main portal vein are patent. No portal venous gas. Review of the MIP images confirms the above findings. NON-VASCULAR Lower chest: Small bilateral pleural effusions with partial compressive atelectasis of the lower lobes. Pneumonia is not excluded. No intra-abdominal free air.  Small free fluid in the pelvis. Hepatobiliary: Subcentimeter hypodense lesion in the posterior right lobe of the liver is too small to characterize. No biliary ductal dilatation. Cholecystectomy. Pancreas: Indeterminate 1.5 x 2.0 cm hypodense lesion in the  head of the pancreas not characterized on this CT, possibly a side branch IPMN. MRI may provide better evaluation on a nonemergent/outpatient basis. No dilatation of the main pancreatic duct. No active inflammatory changes. Spleen: Normal in size without focal abnormality. Adrenals/Urinary Tract: The adrenal glands are unremarkable. Small bilateral renal cysts. There is no hydronephrosis on either side. The visualized ureters and urinary bladder appear unremarkable. Stomach/Bowel: Long segment thickening and inflammatory changes extending from the distal transverse colon to the rectosigmoid in keeping with colitis. Resolution of the previously seen impacted fecal material in the sigmoid colon. There is no bowel obstruction. The appendix is not visualized with certainty. No inflammatory changes identified in the right lower quadrant. Lymphatic: No adenopathy. Reproductive: Hysterectomy.  No suspicious adnexal masses. Other: None Musculoskeletal: Osteopenia with degenerative changes of spine. No acute osseous pathology. IMPRESSION: 1. Long segment colitis extending from the distal transverse colon to the rectosigmoid. No pneumatosis. No portal venous gas or free air. No bowel obstruction. 2. Small bilateral pleural effusions with partial compressive atelectasis of the lower lobes. 3. Indeterminate 1.5 x 2.0 cm hypodense lesion in the head of the pancreas, possibly a side branch IPMN. MRI may provide better evaluation on a nonemergent/outpatient basis. Electronically Signed   By: Angus Bark M.D.   On: 06/28/2023 09:52   CT HEAD WO CONTRAST ( ) Result Date: 06/27/2023 CLINICAL DATA:  89 year old female with left side neurologic deficit for 4-5 days. EXAM: CT HEAD WITHOUT CONTRAST TECHNIQUE: Contiguous axial images were obtained from the base of the skull through the vertex without intravenous contrast. RADIATION DOSE REDUCTION: This exam was performed according to the departmental dose-optimization program  which includes automated exposure control, adjustment of the mA and/or kV according to patient size and/or use of iterative reconstruction technique. COMPARISON:  Head CT 05/20/2023. FINDINGS: Brain: New right thalamic lacunar-type hypodensity in an  area of about 12 mm (series 2, image 14). This tracks toward the posterior limb right internal capsule and is fairly circumscribed. No associated hemorrhage or mass effect. Stable underlying cerebral volume. Gray-white differentiation elsewhere appears stable. No midline shift, ventriculomegaly, mass effect, evidence of mass lesion, intracranial hemorrhage or evidence of cortically based acute infarction. Gray-white matter differentiation is within normal limits throughout the brain. Vascular: Calcified atherosclerosis at the skull base. Stable basal ganglia vascular calcifications. No suspicious intracranial vascular hyperdensity. Skull: Intact.  No acute osseous abnormality identified. Sinuses/Orbits: Visualized paranasal sinuses and mastoids are stable and well aerated. Other: No gaze deviation, acute orbit or scalp soft tissue finding. IMPRESSION: 1. Right thalamic lacunar infarct, new since 05/20/2023 and most likely subacute in this setting. No associated hemorrhage or mass effect. 2. No other acute intracranial abnormality. Electronically Signed   By: Marlise Simpers M.D.   On: 06/27/2023 16:07   DG Abd 1 View Result Date: 06/25/2023 CLINICAL DATA:  Abdominal pain EXAM: ABDOMEN - 1 VIEW COMPARISON:  CT from the previous day. FINDINGS: Scattered large and small bowel gas is noted. No obstructive changes are seen. Contrast is noted within the bladder related to the recent CT. No free air is seen. No acute bony abnormality noted. IMPRESSION: No acute abnormality noted. Electronically Signed   By: Violeta Grey M.D.   On: 06/25/2023 10:47   CT ABDOMEN PELVIS W CONTRAST Result Date: 06/24/2023 CLINICAL DATA:  Sharp abdominal pain with nausea and vomiting EXAM: CT ABDOMEN  AND PELVIS WITH CONTRAST TECHNIQUE: Multidetector CT imaging of the abdomen and pelvis was performed using the standard protocol following bolus administration of intravenous contrast. RADIATION DOSE REDUCTION: This exam was performed according to the departmental dose-optimization program which includes automated exposure control, adjustment of the mA and/or kV according to patient size and/or use of iterative reconstruction technique. CONTRAST:  80mL OMNIPAQUE  IOHEXOL  300 MG/ML  SOLN COMPARISON:  CT 11/25/2009 FINDINGS: Lower chest: Lung bases demonstrate no acute airspace disease. Hepatobiliary: Cholecystectomy. Subcentimeter hypodensity within the right hepatic lobe too small to further characterize. No biliary dilatation Pancreas: Unremarkable. No pancreatic ductal dilatation or surrounding inflammatory changes. Spleen: Normal in size without focal abnormality. Adrenals/Urinary Tract: Adrenal glands are normal. Kidneys show no hydronephrosis. Subcentimeter hypodensities too small to further characterize, no specific imaging follow-up is recommended. The bladder is normal Stomach/Bowel: The stomach is nonenlarged. There is no dilated small bowel. Large amount of stool mixed with fluid, within the ascending, transverse descending and proximal sigmoid colon. Distal sigmoid colon and rectosigmoid colon appear decompressed. Suspicion of slight colon wall thickening and pericolonic edema at the descending and proximal sigmoid colon, series 2 image 53 through 68. Vascular/Lymphatic: Aortic atherosclerosis. No enlarged abdominal or pelvic lymph nodes. Reproductive: Status post hysterectomy. No adnexal masses. Other: No free air.  Small volume free fluid in the pelvis. Musculoskeletal: No acute or suspicious osseous abnormality. IMPRESSION: 1. Large amount of stool mixed with fluid within the ascending, transverse, descending and proximal sigmoid colon. Abrupt appearing caliber change at the distal sigmoid colon with no  stool or fluid distal to this, benign or malignant stricture cannot be excluded at this location. Correlation with colonoscopy is recommended. There is some wall thickening and pericolonic edema involving the splenic flexure and descending colon proximal to the caliber change suggesting colon inflammation. There is no intramural air or free air. No dilated small bowel is seen. 2. Small free fluid in the abdomen and pelvis. 3. Aortic atherosclerosis. Aortic Atherosclerosis (ICD10-I70.0). Electronically Signed  By: Esmeralda Hedge M.D.   On: 06/24/2023 19:12    Labs: Basic Metabolic Panel: Recent Labs  Lab 06/25/23 0609 06/26/23 0404 06/28/23 0203 06/30/23 0516 07/01/23 0551  NA 139 137 136 138 141  K 4.4 3.2* 3.3* 2.9* 4.2  CL 105 108 107 104 107  CO2 22 22 23 25 26   GLUCOSE 201* 127* 98 113* 103*  BUN 46* 39* 13 11 13   CREATININE 1.96* 1.04* 0.81 0.80 0.87  CALCIUM  8.5* 7.8* 8.2* 8.2* 8.1*   CBC: Recent Labs  Lab 06/25/23 0609 06/26/23 0404 06/28/23 0203 06/30/23 0516 07/01/23 0551  WBC 12.3* 7.5 7.5 5.1 5.7  HGB 16.0* 13.3 11.7* 11.2* 11.1*  HCT 45.6 37.8 33.3* 31.6* 32.3*  MCV 92.9 90.2 91.0 91.1 93.1  PLT 207 143* 145* 160 179   Microbiology: No results found for this or any previous visit.  Time coordinating discharge: Over 30 minutes  Ree Candy, MD  [facility] 07/01/2023, 1:38 PM

## 2023-07-01 NOTE — Progress Notes (Signed)
 Physical Therapy Treatment Patient Details Name: Jacqueline Carney MRN: [id number] DOB: 1933/05/19 Today's Date: 07/01/2023   History of Present Illness Jacqueline Carney is a 89 y.o. female with medical history significant of HTN, HLD, prediabetes, GERD, depression, CKD-3A, strokelike symptoms, degenerative disc disease, vascular ectasia of colon, urinary incontinence, skin cancer, colon polyp, who presents with nausea, vomiting, abdominal pain.    PT Comments  Pt received upright in recliner agreeable to PT services. Pt voices some concerns with meal prep living alone. Educated pt on meal prep strategies and even discussed living with son and DIL short term to assist as needed for ADL's. Per son via telephone post session he is amenable to this if needed. Pt improving in gait with RW completing STS to RW at supervision and ambulating 2 laps around NSG station. 1 lap with RW with smooth reciprocal gait with speeds indicative of short community ambulation. 2nd lap without AD, pt is reliant on CGA and regular SUE support on hallway railing for stability with significant reduction in step lengths and foot clearance generally appearing unsteady. Pt and son educated on use of RW at d/c for reduced falls risk. Pt returned to room educated on LLE strength exercise and ambulating back to bathroom with all needs in place with call bell. NSG notified. Pt and son agreeable to POC abd f/u PT recs.    If plan is discharge home, recommend the following: A little help with walking and/or transfers;Assistance with cooking/housework;Help with stairs or ramp for entrance;A little help with bathing/dressing/bathroom   Can travel by private vehicle        Equipment Recommendations  Rolling walker (2 wheels)    Recommendations for Other Services       Precautions / Restrictions Precautions Precautions: Fall Recall of Precautions/Restrictions: Intact Restrictions Weight Bearing Restrictions Per Provider  Order: No     Mobility  Bed Mobility                 Patient Response: Cooperative  Transfers   Equipment used: Rolling walker (2 wheels)   Sit to Stand: Supervision                Ambulation/Gait Ambulation/Gait assistance: Supervision, Contact guard assist Gait Distance (Feet): 360 Feet (200' no AD, 160' with RW) Assistive device: Rolling walker (2 wheels), None   Gait velocity: with RW: 10' in 7 sec = 1.42'/sec. With no AD: 10' in 8 sec= 1.25'/sec Gait velocity interpretation: 1.31 - 2.62 ft/sec, indicative of limited community ambulator   General Gait Details: significant reduction in step lengths and foot clearance with LE's without AD needing CGA. WIth RW pt displays smooth reciprocal gait   Stairs             Wheelchair Mobility     Tilt Bed Tilt Bed Patient Response: Cooperative  Modified Rankin (Stroke Patients Only)       Balance Overall balance assessment: Needs assistance Sitting-balance support: No upper extremity supported, Feet supported Sitting balance-Leahy Scale: Good     Standing balance support: No upper extremity supported, During functional activity Standing balance-Leahy Scale: Fair                              Hotel manager: No apparent difficulties  Cognition Arousal: Alert Behavior During Therapy: WFL for tasks assessed/performed   PT - Cognitive impairments: No apparent impairments  Following commands: Intact      Cueing Cueing Techniques: Verbal cues  Exercises Other Exercises Other Exercises: Self care/meal management at home. x5STS with LLE under BOS for strengthening to RW, CGA.    General Comments        Pertinent Vitals/Pain Pain Assessment Pain Assessment: No/denies pain    Home Living                          Prior Function            PT Goals (current goals can now be found in the care plan section)  Acute Rehab PT Goals Patient Stated Goal: return home and improve strength PT Goal Formulation: With patient Time For Goal Achievement: 07/11/23 Potential to Achieve Goals: Good Progress towards PT goals: Progressing toward goals    Frequency    Min 3X/week      PT Plan      Co-evaluation              AM-PAC PT "6 Clicks" Mobility   Outcome Measure  Help needed turning from your back to your side while in a flat bed without using bedrails?: A Little Help needed moving from lying on your back to sitting on the side of a flat bed without using bedrails?: A Little Help needed moving to and from a bed to a chair (including a wheelchair)?: A Little Help needed standing up from a chair using your arms (e.g., wheelchair or bedside chair)?: A Little Help needed to walk in hospital room?: A Little Help needed climbing 3-5 steps with a railing? : A Little 6 Click Score: 18    End of Session Equipment Utilized During Treatment: Gait belt Activity Tolerance: Patient tolerated treatment well Patient left:  (seated in bathroom) Nurse Communication: Mobility status PT Visit Diagnosis: Other abnormalities of gait and mobility (R26.89);Difficulty in walking, not elsewhere classified (R26.2);Muscle weakness (generalized) (M62.81)     Time: 1610-9604 PT Time Calculation (min) (ACUTE ONLY): 18 min  Charges:    $Self Care/Home Management: 8-22 PT General Charges $$ ACUTE PT VISIT: 1 Visit                     Marc Senior. Fairly IV, PT, DPT Physical Therapist- [facility]  07/01/2023, 1:43 PM

## 2023-07-01 NOTE — TOC Transition Note (Signed)
 Transition of Care [facility]) - Discharge Note   Patient Details  Name: Jacqueline Carney MRN: [id number] Date of Birth: 03/28/1933  [facility] CM/SW Contact:  Alexandra Ice, RN Phone Number: 07/01/2023, 2:02 PM   Clinical Narrative:    Patient to discharge home, with outpatient therapy. RW was delivered by [employer] to bedside. No other TOC needs identified.    Final next level of care: OP Rehab Barriers to Discharge: Barriers Resolved   Patient Goals and CMS Choice Patient states their goals for this hospitalization and ongoing recovery are:: go home and get better CMS Medicare.gov Compare Post Acute Care list provided to:: Patient Choice offered to / list presented to : Patient      Discharge Placement                Patient to be transferred to facility by: Family Name of family member notified: Family Patient and family notified of of transfer: 07/01/23  Discharge Plan and Services Additional resources added to the After Visit Summary for                  DME Arranged: Walker rolling DME Agency: [employer] Date DME Agency Contacted: 07/01/23 Time DME Agency Contacted: [phone] Representative spoke with at DME Agency: Montie Apley Arranged: NA          Social Drivers of Health (SDOH) Interventions SDOH Screenings   Food Insecurity: No Food Insecurity (06/25/2023)  Housing: Low Risk  (06/25/2023)  Transportation Needs: No Transportation Needs (06/25/2023)  Utilities: Not At Risk (06/25/2023)  Financial Resource Strain: Low Risk  (02/21/2023)   Received from [facility]  Social Connections: Moderately Integrated (06/25/2023)  Tobacco Use: Low Risk  (06/26/2023)  Recent Concern: Tobacco Use - Medium Risk (06/14/2023)   Received from [facility]     Readmission Risk Interventions     No data to display

## 2023-07-01 NOTE — Discharge Instructions (Signed)
 Follow up referrals have been placed for you to see  GI, Dr Ole Berkeley outpatient,  Neurology for your stroke follow up- continue with PT and aspirin daily. Outpatient PT referral was also placed.

## 2023-07-01 NOTE — Progress Notes (Signed)
 Patient ID: Jacqueline Carney, female   DOB: 1934/03/05, 89 y.o.   MRN: [id number]     SURGICAL PROGRESS NOTE   Hospital Day(s): 6.   Interval History: Patient seen and examined, no acute events or new complaints overnight. Patient reports continued feeling well.  She denies any abdominal pain.  She has been tolerating diet.  She denies any bright red blood per rectum.  She does endorses having black stool.  She denies any nausea or vomiting.  She endorsed that the pain and swelling has completely resolved.  She has been ambulating well.  She is pretty independent.  Vital signs in last 24 hours: [min-max] current  Temp:  [98 F (36.7 C)-99.2 F (37.3 C)] 98 F (36.7 C) (04/28 0836) Pulse Rate:  [80-89] 89 (04/28 0836) Resp:  [16-19] 18 (04/28 0836) BP: (136-150)/(49-66) 139/66 (04/28 0836) SpO2:  [95 %-100 %] 100 % (04/28 0836)     Height: 5' (152.4 cm) Weight: 81 kg BMI (Calculated): 34.88   Physical Exam:  Constitutional: alert, cooperative and no distress  Respiratory: breathing non-labored at rest  Cardiovascular: regular rate and sinus rhythm  Gastrointestinal: soft, non-tender, and non-distended  Labs:     Latest Ref Rng & Units 07/01/2023    5:51 AM 06/30/2023    5:16 AM 06/28/2023    2:03 AM  CBC  WBC 4.0 - 10.5 K/uL 5.7  5.1  7.5   Hemoglobin 12.0 - 15.0 g/dL 40.9  81.1  91.4   Hematocrit 36.0 - 46.0 % 32.3  31.6  33.3   Platelets 150 - 400 K/uL 179  160  145       Latest Ref Rng & Units 07/01/2023    5:51 AM 06/30/2023    5:16 AM 06/28/2023    2:03 AM  CMP  Glucose 70 - 99 mg/dL 782  956  98   BUN 8 - 23 mg/dL 13  11  13    Creatinine 0.44 - 1.00 mg/dL 2.13  0.86  5.78   Sodium 135 - 145 mmol/L 141  138  136   Potassium 3.5 - 5.1 mmol/L 4.2  2.9  3.3   Chloride 98 - 111 mmol/L 107  104  107   CO2 22 - 32 mmol/L 26  25  23    Calcium  8.9 - 10.3 mg/dL 8.1  8.2  8.2     Imaging studies: No new pertinent imaging studies   Assessment/Plan:  89 y.o. female with  ischemic colitis, complicated by pertinent comorbidities including hypertension, hyperlipidemia, GERD, chronic kidney disease stage IIIa.   Ischemic colitis - Diagnosed on colonoscopy today.  Described as severe ischemic changes but no transmural ulcer identified.  Affected colon includes descending, splenic flexure and transverse colon.  Ascending colon not inspected. - Admission CT scan of the abdomen and pelvis without sign of pneumatosis or perforation. - Repeated CTA of the abdomen and pelvis yesterday shows suspected colitis without progression.  There is no pneumatosis, no portal vein gas. - Patient continue adequate progress in recovery from ischemic colitis.  She continued pain-free.  She continue without fever.  She has been tolerating diet.  She said that she did not tried the soft diet yesterday but she ordered some scrambled egg this morning. - No sign of active rectal bleeding.  Stable hemoglobin of 11.1 - From surgical standpoint if patient tolerates soft diet she will be able to be discharged home without surgical intervention. - I recommend to complete 14 days of antibiotics Cipro   and Flagyl .  Prescription sent to pharmacy. - Also recommend outpatient follow-up with GI due to chronic constipation, irregular bowel movements and history of colitis  Lucila Rye, MD

## 2023-07-01 NOTE — Plan of Care (Signed)

## 2023-07-02 ENCOUNTER — Telehealth: Payer: Self-pay | Admitting: Urology

## 2023-07-02 NOTE — Telephone Encounter (Signed)
 Pt's son, Jonell Neptune, called to let us  know Gemtesa  isn't working for pt.  She has a cysto scheduled for 6/2.  He wants to know if she can try something else, or if she would need to see a PA before then?

## 2023-07-05 ENCOUNTER — Other Ambulatory Visit: Payer: Self-pay

## 2023-07-05 MED ORDER — MIRABEGRON ER 50 MG PO TB24: 50.0000 mg | ORAL_TABLET | Freq: Every day | ORAL | 11 refills | Status: DC

## 2023-07-05 NOTE — Telephone Encounter (Signed)
 Per Dr.Macdiarmid, Myrbetriq  50 mg daily 30 x 11. Meds sent to pharmacy, pt's son informed.

## 2023-07-13 NOTE — Anesthesia Postprocedure Evaluation (Signed)
 Anesthesia Post Note  Patient: Jacqueline Carney  Procedure(s) Performed: COLONOSCOPY  Patient location during evaluation: Endoscopy Anesthesia Type: General Level of consciousness: awake and alert Pain management: pain level controlled Vital Signs Assessment: post-procedure vital signs reviewed and stable Respiratory status: spontaneous breathing, nonlabored ventilation, respiratory function stable and patient connected to nasal cannula oxygen Cardiovascular status: blood pressure returned to baseline and stable Postop Assessment: no apparent nausea or vomiting Anesthetic complications: no   No notable events documented.   Last Vitals:  Vitals:   07/01/23 0432 07/01/23 0836  BP: (!) 136/50 139/66  Pulse: 82 89  Resp: 17 18  Temp: 37.1 C 36.7 C  SpO2: 95% 100%    Last Pain:  Vitals:   07/01/23 0836  TempSrc:   PainSc: 0-No pain                 Vanice Genre

## 2023-07-15 ENCOUNTER — Other Ambulatory Visit: Payer: Self-pay

## 2023-07-31 ENCOUNTER — Other Ambulatory Visit: Payer: Self-pay

## 2023-08-05 ENCOUNTER — Ambulatory Visit: Admitting: Urology

## 2023-08-05 VITALS — BP 137/70 | HR 80 | Ht 60.0 in | Wt 169.5 lb

## 2023-08-05 DIAGNOSIS — N3946 Mixed incontinence: Secondary | ICD-10-CM

## 2023-08-05 DIAGNOSIS — N3941 Urge incontinence: Secondary | ICD-10-CM

## 2023-08-05 LAB — MICROSCOPIC EXAMINATION: Epithelial Cells (non renal): >10 /HPF — AB (ref 0–10)

## 2023-08-05 LAB — URINALYSIS, COMPLETE
Bilirubin, UA: NEGATIVE
Glucose, UA: NEGATIVE
Ketones, UA: NEGATIVE
Leukocytes,UA: NEGATIVE
Nitrite, UA: POSITIVE — AB
Protein,UA: NEGATIVE
RBC, UA: NEGATIVE
Specific Gravity, UA: 1.01 (ref 1.005–1.030)
Urobilinogen, Ur: 1 mg/dL (ref 0.2–1.0)
pH, UA: 7 (ref 5.0–7.5)

## 2023-08-05 MED ORDER — GEMTESA 75 MG PO TABS: 75.0000 mg | ORAL_TABLET | Freq: Every day | ORAL | 11 refills | Status: AC

## 2023-08-05 MED ORDER — NITROFURANTOIN MONOHYD MACRO 100 MG PO CAPS: 100.0000 mg | ORAL_CAPSULE | Freq: Two times a day (BID) | ORAL | 0 refills | Status: AC

## 2023-08-05 NOTE — Progress Notes (Signed)
 08/05/2023 9:29 AM   Jacqueline Carney 1933-12-24 [id number]  Referring provider: Melchor Spoon, MD [address] [facility] [place],  [place] [address]  Chief Complaint  Patient presents with   Cysto    HPI: I was consulted to assess the patient's urge incontinence.  She leaks when she goes from a sitting to standing station.  She has high-volume bedwetting.  She soaks 4 pads a day.  No stress incontinence   She voids every 30 to 60 minutes and cannot hold it for 2 hours.  She gets up 4-6 times at night with no ankle edema   She likely had a bladder suspension procedure many years ago   She has had low back and neck surgery.  She describes trying 1 medication years ago but her dentist said stop it due to dry mouth issues in her teeth.  She has had a hysterectomy    Patient has urge incontinence and bedwetting. She has high-volume leakage. She has significant frequency and nocturia. She will return for pelvic examination and cystoscopy on Gemtesa  samples and prescription. She understands she may needed test in the future without details   Today It appears that the patient did well initially on Gemtesa  and then the medicine stopped working.  She had had a significant decrease in incontinence for a few weeks.  She called in and was switched to Myrbetriq  which did not help.  She currently has high-volume urge incontinence and bedwetting.  Frequency stable  On pelvic examination she had mild grade 2 hypermobility bladder neck and negative cough test after cystoscopy.  No mesh extrusion and no prolapse  Cystoscopy: Patient underwent flexible cystoscopy.  Bladder mucosa and trigone were normal.  Urine was cloudy.  No foreign body in bladder or urethra.     PMH: Past Medical History:  Diagnosis Date   Bladder incontinence    Cancer (HCC)    skin   GERD (gastroesophageal reflux disease)    H/O degenerative disc disease    Hypercholesterolemia     Hypertension    Palpitations 09/2010   stress echo negative for ischemia, holter without sigmificant abnormality   Sensorineural hearing loss (SNHL) of both ears    Vascular ectasia of colon    Vitamin D deficiency, unspecified     Surgical History: Past Surgical History:  Procedure Laterality Date   ABDOMINAL HYSTERECTOMY     BACK SURGERY     BROW LIFT Bilateral 01/03/2023   Procedure: BLEPHAROPLASTY UPPER EYELID; W/EXCESS SKIN BILATERAL;  Surgeon: Zacarias Hermann, MD;  Location: [facility];  Service: Ophthalmology;  Laterality: Bilateral;   CATARACT EXTRACTION W/PHACO Left 10/04/2021   Procedure: CATARACT EXTRACTION PHACO AND INTRAOCULAR LENS PLACEMENT (IOC) LEFT 6.09 00:49.9;  Surgeon: Annell Kidney, MD;  Location: [facility];  Service: Ophthalmology;  Laterality: Left;   CATARACT EXTRACTION W/PHACO Right 10/18/2021   Procedure: CATARACT EXTRACTION PHACO AND INTRAOCULAR LENS PLACEMENT (IOC) RIGHT;  Surgeon: Annell Kidney, MD;  Location: [facility];  Service: Ophthalmology;  Laterality: Right;  10.59 1:23.2   CHOLECYSTECTOMY     COLONOSCOPY     with colon polyps   COLONOSCOPY N/A 06/26/2023   Procedure: COLONOSCOPY;  Surgeon: Marnee Sink, MD;  Location: [facility];  Service: Endoscopy;  Laterality: N/A;   COLONOSCOPY WITH PROPOFOL  N/A 07/26/2017   Procedure: COLONOSCOPY WITH PROPOFOL ;  Surgeon: Cassie Click, MD;  Location: [facility] END[facility]COPY;  Service: Endoscopy;  Laterality: N/A;    Home Medications:  Allergies as of 08/05/2023   No Known Allergies      Medication List        Accurate as of August 05, 2023  9:29 AM. If you have any questions, ask your nurse or doctor.          amLODipine  10 MG tablet Commonly known as: NORVASC  Take 10 mg by mouth daily.   aspirin  EC 81 MG tablet Take 1 tablet (81 mg total) by mouth daily. Swallow whole.   Calcium  Carbonate-Vitamin D 600-400 MG-UNIT tablet Take 1 tablet by mouth daily.    escitalopram  5 MG tablet Commonly known as: LEXAPRO  Take 5 mg by mouth daily.   Gemtesa  75 MG Tabs Generic drug: Vibegron  Take 1 tablet (75 mg total) by mouth daily.   mirabegron  ER 50 MG Tb24 tablet Commonly known as: MYRBETRIQ  Take 1 tablet (50 mg total) by mouth daily.   pantoprazole  40 MG tablet Commonly known as: PROTONIX  Take 40 mg by mouth daily.        Allergies: No Known Allergies  Family History: Family History  Problem Relation Age of Onset   Colon cancer Mother    Breast cancer Neg Hx     Social History:  reports that she has never smoked. She has never used smokeless tobacco. She reports that she does not drink alcohol and does not use drugs.  ROS:                                        Physical Exam: There were no vitals taken for this visit.  Constitutional:  Alert and oriented, No acute distress. HEENT: [place] AT, moist mucus membranes.  Trachea midline, no masses.   Laboratory Data: Lab Results  Component Value Date   WBC 5.7 07/01/2023   HGB 11.1 (L) 07/01/2023   HCT 32.3 (L) 07/01/2023   MCV 93.1 07/01/2023   PLT 179 07/01/2023    Lab Results  Component Value Date   CREATININE 0.87 07/01/2023    No results found for: "PSA"  No results found for: "TESTOSTERONE"  Lab Results  Component Value Date   HGBA1C 5.1 06/28/2023    Urinalysis    Component Value Date/Time   COLORURINE YELLOW (A) 12/09/2017 0939   APPEARANCEUR HAZY (A) 12/09/2017 0939   LABSPEC 1.014 12/09/2017 0939   PHURINE 6.0 12/09/2017 0939   GLUCOSEU NEGATIVE 12/09/2017 0939   HGBUR NEGATIVE 12/09/2017 0939   BILIRUBINUR NEGATIVE 12/09/2017 0939   KETONESUR NEGATIVE 12/09/2017 0939   PROTEINUR NEGATIVE 12/09/2017 0939   NITRITE NEGATIVE 12/09/2017 0939   LEUKOCYTESUR NEGATIVE 12/09/2017 0939    Pertinent Imaging: Urine reviewed and in keeping with a bladder infection.  Assessment & Plan: Patient has urge incontinence and bedwetting.   She has failed Gemtesa  and Myrbetriq  and another medication many years ago.  The role of urodynamics discussed.  She understands the Gemtesa  may not have stopped working if she has a bladder infection.  I thought was reasonable give her more Gemtesa  samples and a prescription of Macrodantin sent in.  If she does well she can cancel the urodynamics  Patient wants to try the Macrodantin and Gemtesa  samples.  I will see her back in 8 weeks.  She will think about urodynamics.  1 last medication or treatment without urodynamics could be entertained.  She may or may not go to [place]  1. Urgency incontinence (Primary)  -  Urinalysis, Complete  2. Urinary incontinence, unspecified type  - Urinalysis, Complete   No follow-ups on file.  Devorah Fonder, MD  [facility] [address], [address] [place], [place] [address] [phone]

## 2023-08-08 LAB — CULTURE, URINE COMPREHENSIVE

## 2023-08-13 ENCOUNTER — Ambulatory Visit: Payer: Self-pay

## 2023-08-13 DIAGNOSIS — N3946 Mixed incontinence: Secondary | ICD-10-CM

## 2023-08-13 MED ORDER — SULFAMETHOXAZOLE-TRIMETHOPRIM 800-160 MG PO TABS: 1.0000 | ORAL_TABLET | Freq: Two times a day (BID) | ORAL | 0 refills | Status: AC

## 2023-08-16 ENCOUNTER — Other Ambulatory Visit: Payer: Self-pay | Admitting: Internal Medicine

## 2023-08-16 DIAGNOSIS — Q283 Other malformations of cerebral vessels: Secondary | ICD-10-CM

## 2023-08-16 DIAGNOSIS — R202 Paresthesia of skin: Secondary | ICD-10-CM

## 2023-08-16 DIAGNOSIS — R42 Dizziness and giddiness: Secondary | ICD-10-CM

## 2023-08-16 DIAGNOSIS — M5412 Radiculopathy, cervical region: Secondary | ICD-10-CM

## 2023-08-16 DIAGNOSIS — Z8673 Personal history of transient ischemic attack (TIA), and cerebral infarction without residual deficits: Secondary | ICD-10-CM

## 2023-08-21 ENCOUNTER — Ambulatory Visit
Admission: RE | Admit: 2023-08-21 | Discharge: 2023-08-21 | Disposition: A | Discharge status: Home / Self Care | Source: Ambulatory Visit | Attending: Internal Medicine | Admitting: Internal Medicine

## 2023-08-21 DIAGNOSIS — G959 Disease of spinal cord, unspecified: Secondary | ICD-10-CM | POA: Diagnosis present

## 2023-08-21 DIAGNOSIS — M5412 Radiculopathy, cervical region: Secondary | ICD-10-CM | POA: Diagnosis present

## 2023-08-27 ENCOUNTER — Ambulatory Visit
Admission: RE | Admit: 2023-08-27 | Discharge: 2023-08-27 | Disposition: A | Discharge status: Home / Self Care | Source: Ambulatory Visit | Attending: Internal Medicine | Admitting: Internal Medicine

## 2023-08-27 DIAGNOSIS — R202 Paresthesia of skin: Secondary | ICD-10-CM | POA: Insufficient documentation

## 2023-08-27 DIAGNOSIS — Q283 Other malformations of cerebral vessels: Secondary | ICD-10-CM | POA: Diagnosis present

## 2023-08-27 DIAGNOSIS — Z8673 Personal history of transient ischemic attack (TIA), and cerebral infarction without residual deficits: Secondary | ICD-10-CM | POA: Insufficient documentation

## 2023-08-27 DIAGNOSIS — R200 Anesthesia of skin: Secondary | ICD-10-CM | POA: Insufficient documentation

## 2023-08-27 DIAGNOSIS — R42 Dizziness and giddiness: Secondary | ICD-10-CM | POA: Diagnosis present

## 2023-10-14 ENCOUNTER — Ambulatory Visit: Admitting: Urology

## 2023-11-06 ENCOUNTER — Other Ambulatory Visit: Payer: Self-pay | Admitting: Neurology

## 2023-11-06 DIAGNOSIS — M79604 Pain in right leg: Secondary | ICD-10-CM

## 2023-11-06 DIAGNOSIS — R2689 Other abnormalities of gait and mobility: Secondary | ICD-10-CM

## 2023-11-07 ENCOUNTER — Ambulatory Visit
Admission: RE | Admit: 2023-11-07 | Discharge: 2023-11-07 | Disposition: A | Discharge status: Home / Self Care | Source: Ambulatory Visit | Attending: Neurology | Admitting: Neurology

## 2023-11-07 DIAGNOSIS — R2689 Other abnormalities of gait and mobility: Secondary | ICD-10-CM | POA: Diagnosis present

## 2023-11-07 DIAGNOSIS — M79605 Pain in left leg: Secondary | ICD-10-CM | POA: Insufficient documentation

## 2023-11-07 DIAGNOSIS — M79604 Pain in right leg: Secondary | ICD-10-CM | POA: Insufficient documentation

## 2023-11-11 IMAGING — MG MM DIGITAL SCREENING BILAT W/ TOMO AND CAD
8 series · 8 of 24 positions shown · non-contrast
Comparison: Previous exam(s).

CLINICAL DATA: Screening.

EXAM:
DIGITAL SCREENING BILATERAL MAMMOGRAM WITH TOMOSYNTHESIS AND CAD
TECHNIQUE: Bilateral screening digital craniocaudal and mediolateral oblique
mammograms were obtained. Bilateral screening digital breast
tomosynthesis was performed. The images were evaluated with
computer-aided detection.

[R CC synth-2D]
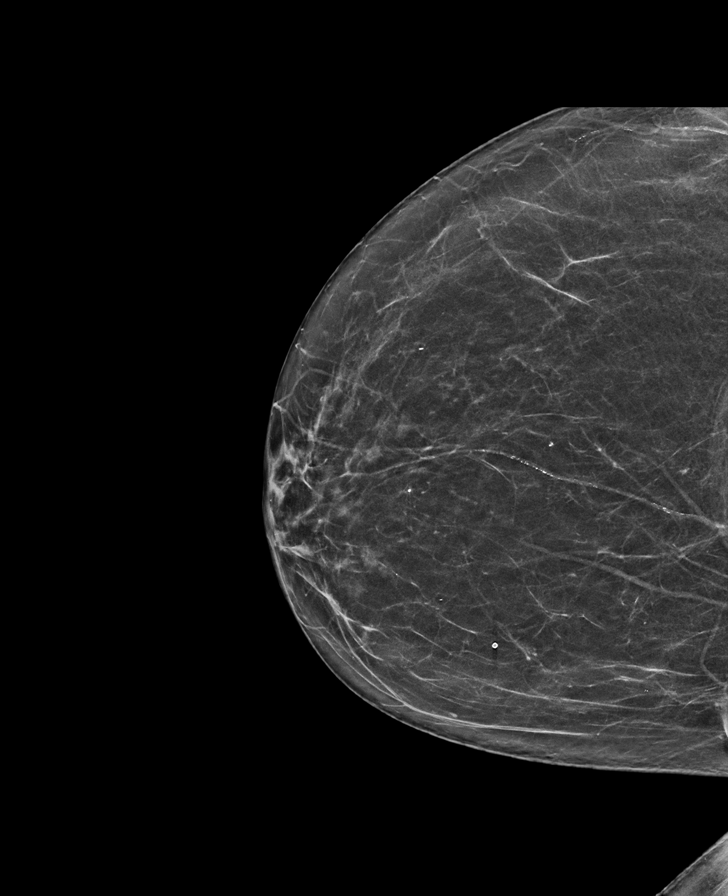

[L MLO synth-2D]
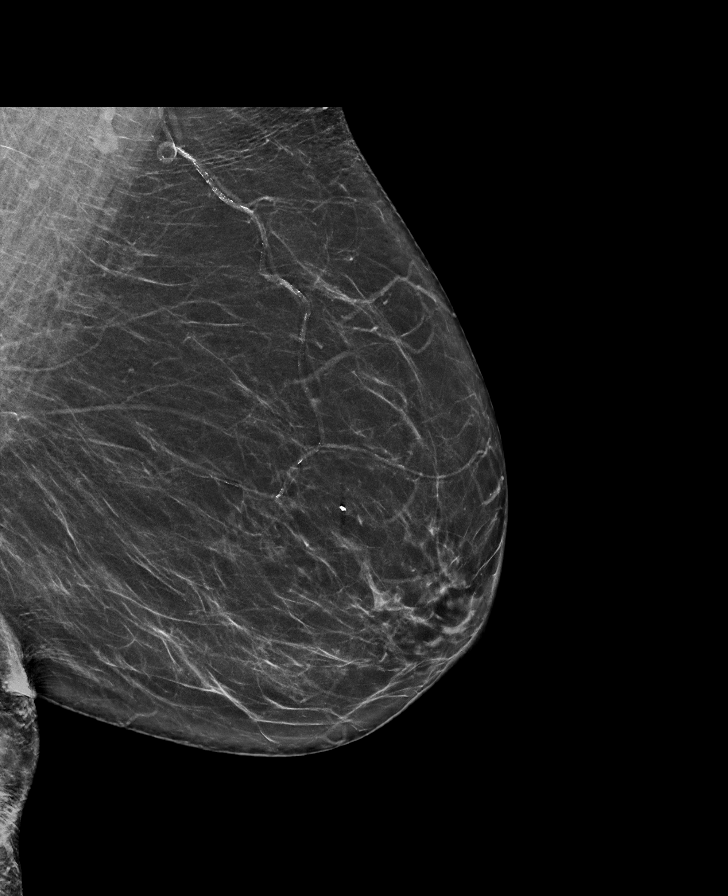

[L CC synth-2D]
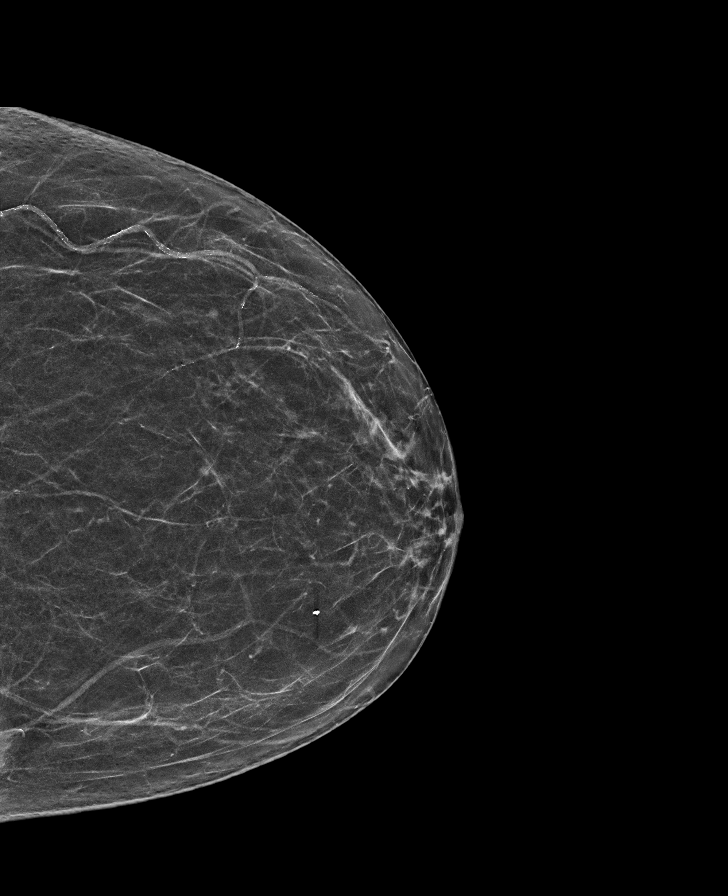

[R MLO synth-2D]
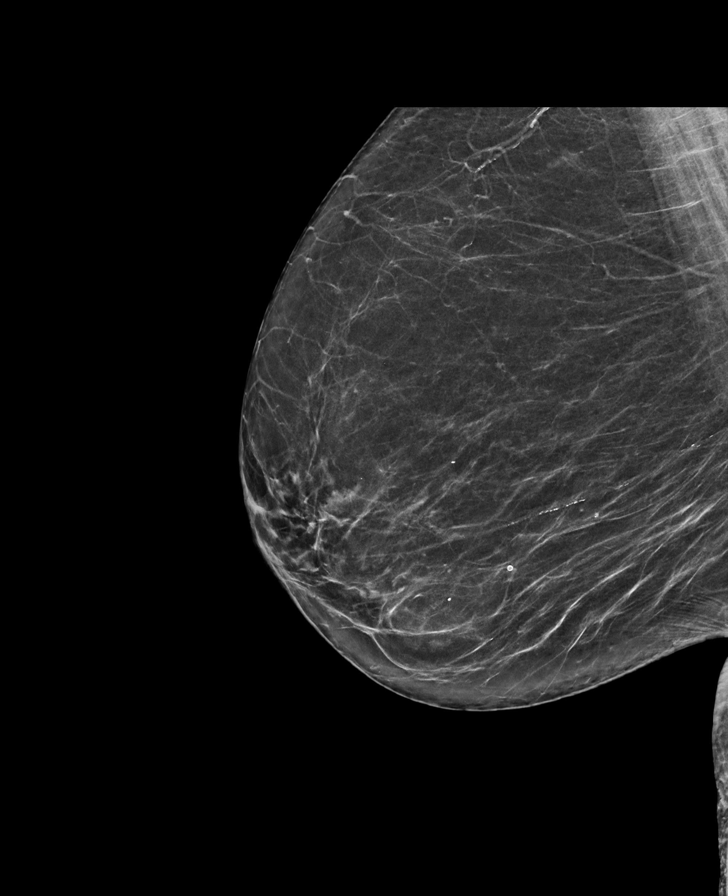

[R MLO tomo · tomo slice 37/72.0]
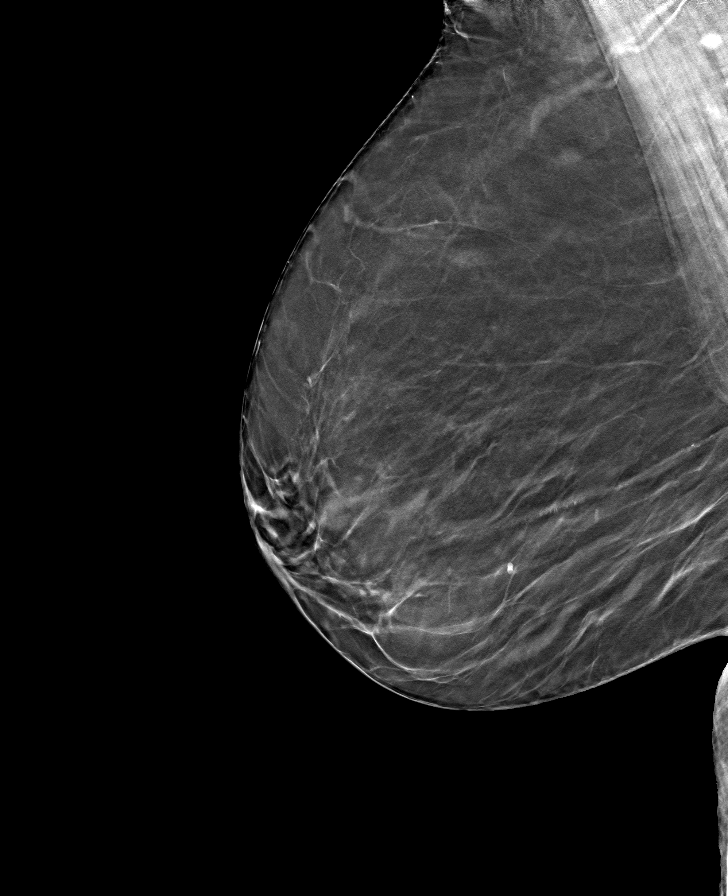

[R CC tomo · tomo slice 33/66.0]
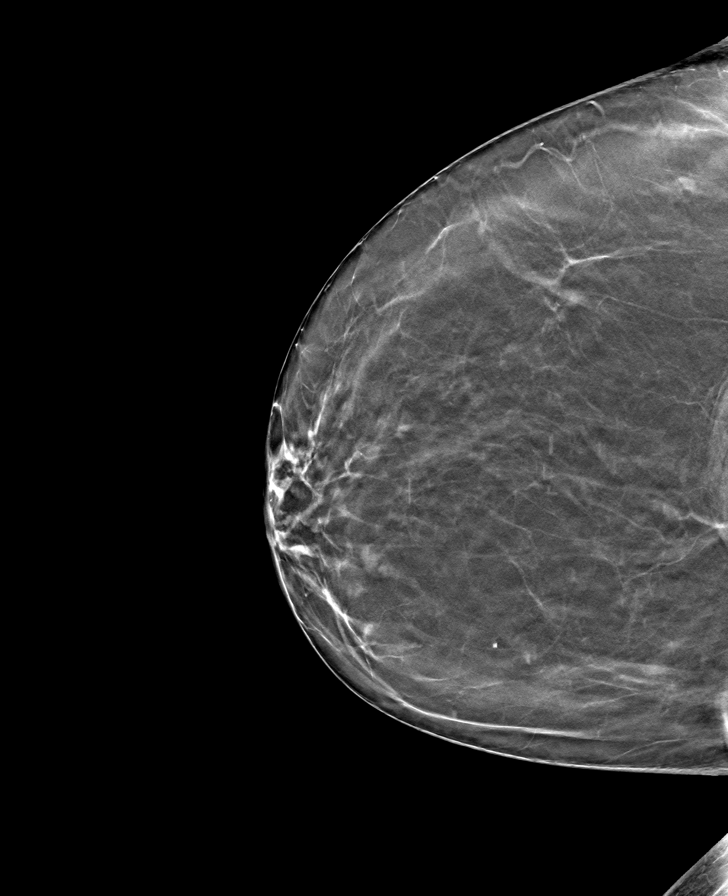

[L MLO tomo · tomo slice 37/73.0]
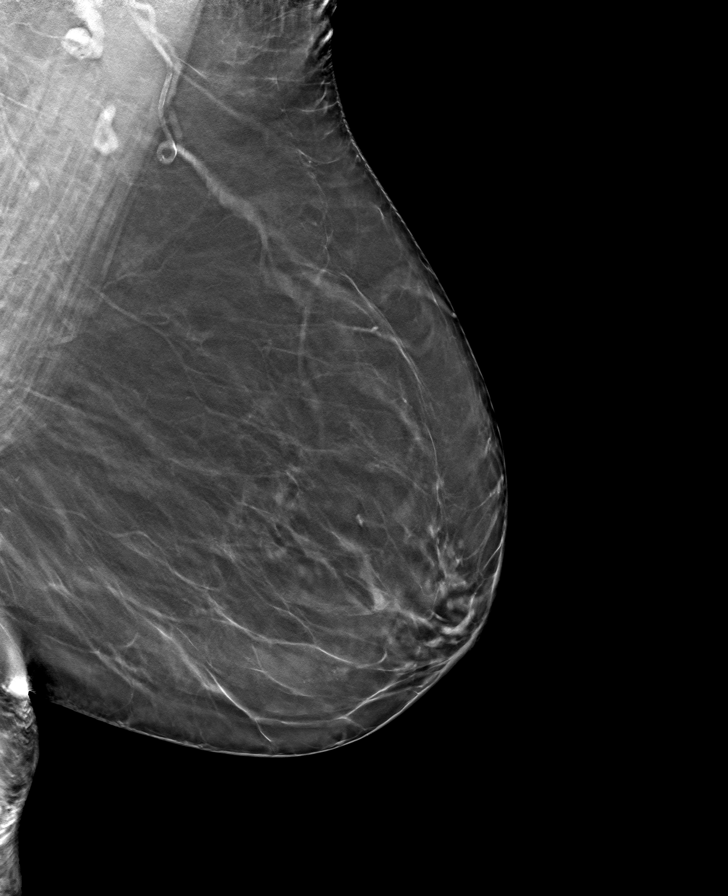

[L CC tomo · tomo slice 33/64.0]
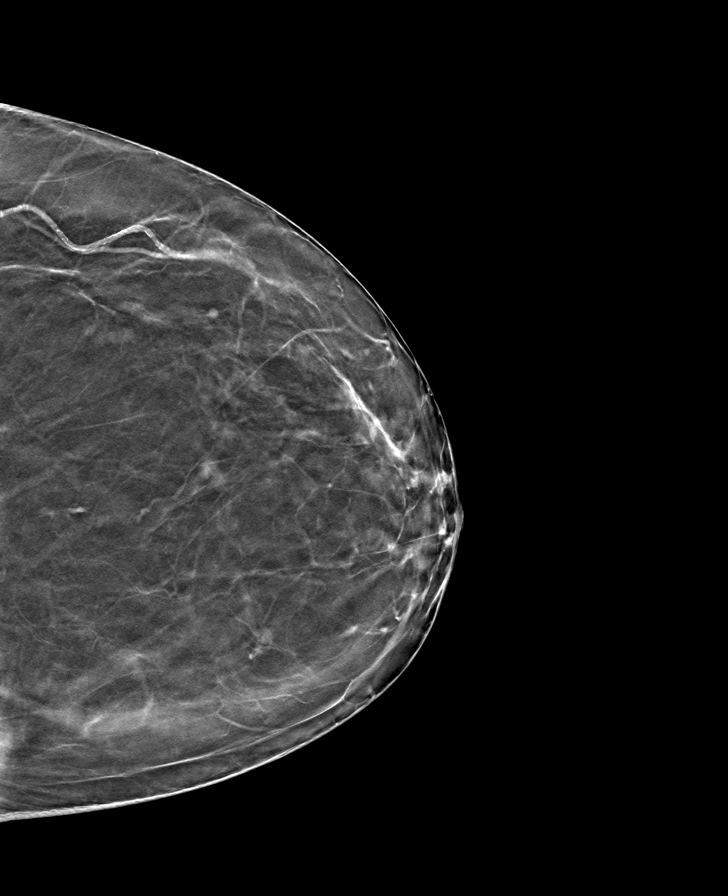

[8 of 24 positions shown; findings below may reference images not displayed]

ACR Breast Density Category b: There are scattered areas of
fibroglandular density.
FINDINGS: There are no findings suspicious for malignancy.
IMPRESSION: No mammographic evidence of malignancy. A result letter of this
screening mammogram will be mailed directly to the patient.

RECOMMENDATION:
Screening mammogram in one year. (Code:51-O-LD2)

BI-RADS CATEGORY  1: Negative.

## 2024-02-19 ENCOUNTER — Encounter: Payer: Self-pay | Admitting: Podiatry

## 2024-02-19 ENCOUNTER — Ambulatory Visit: Admitting: Podiatry

## 2024-02-19 DIAGNOSIS — M2042 Other hammer toe(s) (acquired), left foot: Secondary | ICD-10-CM

## 2024-02-19 NOTE — Progress Notes (Signed)
 She presents today for chief concern of to toes on her left foot that are turning darker at times.  She states that she has neuropathy and has had a stroke and has concerns about the discoloration of those toes.  Objective: Pulses are palpable bilaterally.  Hair distribution dorsum of the foot is present bilaterally.  She has cocked up hammertoe deformities #2 and #3 of the left foot with discoloration secondary to blanching of the PIPJ due to his deformity.  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  Assessment: Hammertoe deformities no significant podiatric issue with this.  Plan: Follow-up with us  on an as-needed basis.

## 2024-03-11 ENCOUNTER — Other Ambulatory Visit: Payer: Self-pay | Admitting: Internal Medicine

## 2024-03-11 DIAGNOSIS — Z1231 Encounter for screening mammogram for malignant neoplasm of breast: Secondary | ICD-10-CM

## 2024-04-13 ENCOUNTER — Encounter
